# Patient Record
Sex: Male | Born: 1965 | Race: Asian | Hispanic: No | Marital: Married | Smoking: Never smoker
Health system: Southern US, Community
[De-identification: ages and names within clinical notes are randomized; demographics above are authoritative.]

## PROBLEM LIST (undated history)

## (undated) DIAGNOSIS — E785 Hyperlipidemia, unspecified: Secondary | ICD-10-CM

## (undated) DIAGNOSIS — M5430 Sciatica, unspecified side: Secondary | ICD-10-CM

## (undated) DIAGNOSIS — M5440 Lumbago with sciatica, unspecified side: Secondary | ICD-10-CM

## (undated) HISTORY — DX: Hyperlipidemia, unspecified: E78.5

## (undated) HISTORY — DX: Sciatica, unspecified side: M54.30

## (undated) HISTORY — DX: Lumbago with sciatica, unspecified side: M54.40

## (undated) HISTORY — PX: TONSILLECTOMY: SUR1361

---

## 2017-05-06 ENCOUNTER — Ambulatory Visit (INDEPENDENT_AMBULATORY_CARE_PROVIDER_SITE_OTHER): Payer: Self-pay | Admitting: Family

## 2017-05-06 VITALS — BP 122/82 | HR 62 | Temp 98.3°F | Resp 16 | Ht 70.0 in | Wt 176.0 lb

## 2017-05-06 DIAGNOSIS — K5900 Constipation, unspecified: Secondary | ICD-10-CM | POA: Insufficient documentation

## 2017-05-06 NOTE — Progress Notes (Signed)
Blake Rhodes  Chief Complaint  Patient presents with  . Abdominal Pain    lower right by the navel x 3-4 dys      ICD-10-CM   1. Constipation, unspecified constipation type K59.00     Patient Active Problem List   Diagnosis Date Noted  . Constipation 05/06/2017    No past medical history on file.  No past surgical history on file.  No Known Allergies  BP 122/82 (BP Location: Right Arm, Patient Position: Sitting, Cuff Size: Large)   Pulse 62   Temp 98.3 F (36.8 C) (Oral)   Resp 16   Ht  (1.778 m)   Wt 176 lb (79.8 kg)   SpO2 98%   BMI 25.25 kg/m   Review of Systems  Constitutional: Negative for chills, diaphoresis, fever, malaise/fatigue and weight loss.  HENT: Negative.   Eyes: Negative.  Negative for blurred vision.  Respiratory: Negative.   Cardiovascular: Negative.  Negative for chest pain.  Gastrointestinal: Positive for abdominal pain and constipation. Negative for blood in stool, diarrhea, heartburn, nausea and vomiting.  Genitourinary: Negative.   Musculoskeletal: Negative for back pain, falls, joint pain, myalgias and neck pain.       Pt had a question about left trapezius tension unrelated to CC of GI upset  Skin: Negative for rash.  Neurological: Negative.  Negative for dizziness, tremors, focal weakness, weakness and headaches.  Endo/Heme/Allergies: Negative.   Psychiatric/Behavioral: Negative.    Physical Exam  Constitutional: He is oriented to person, place, and time. He appears well-developed and well-nourished.  HENT:  Head: Normocephalic and atraumatic.  Mouth/Throat: Oropharynx is clear and moist.  Eyes: Conjunctivae are normal.  Neck: Normal range of motion. Neck supple.  Cardiovascular: Normal rate, regular rhythm and normal heart sounds.   Pulmonary/Chest: Effort normal and breath sounds normal.  Abdominal: Soft. He exhibits no distension and no mass. There is tenderness. There is no rebound and no guarding. No hernia.  BSA and WNL 3  quadrants except Hypoactive (yet audible) BS RLQ, mild TTP no guarding, - Murphy's, - Psoas, - radiation to other areas, pain localized to RLQ 2in to immediate right of naval, no visible distension, yet pt has reported distension and feeling overly full post-prandial  Genitourinary:  Genitourinary Comments: deferred  Musculoskeletal: Normal range of motion. He exhibits no tenderness.  Neurological: He is alert and oriented to person, place, and time.  Skin: Skin is warm and dry. Capillary refill takes less than 2 seconds.  Psychiatric: He has a normal mood and affect. His behavior is normal.  Vitals reviewed.   No results found for this or any previous visit (from the past 72 hour(s)).  No current outpatient prescriptions on file.  Subjective: "Starting Tuesday, I was having some pain in my abdomen when I was out of town at a conference. I work out usually twice a week. I worked out Wednesday when I returned."  Objective: Pt seen and chart reviewed. Pt is alert, oriented x3, calm, cooperative, appropriate, and in NAD. Pt reported mild RLQ epigastric pain at a focal point 2in to right of naval and feelings of distension even when eating small amounts, persistent and slightly worse since Tuesday, but not severe. Pt denies any known changes in diet (he reports healthy vegetarian diet), drinks, supplements, exotic foods, medications, or new food dishes recently. No known hx of similar symptoms. No known injury or straining in the gym (light cardio and machines; no heavy weights or high-impact routines). Pt reports that  approximately every 3-28mo, he does a "cleanse" where he may use magnesium citrate x1 bottle but has not done this in a long time. Denies any use of other laxatives or stool softeners. Denies blood in stool. No diarrhea, no vomiting. Pt reports mild constipation and reported that his stool was harder than usual and he felt as though he couldn't finish going properly. Asked pt about his  experience with mag citrate and he reports he can take it and have 3-4 good bowel movements, but no extreme reactions such as loose stools all evening/day.   See detailed ROS and physical exam details above.   Pt mentioned tightness in his left trapezius muscle, intermittent, since he moved here recently from [place], but not before the move. 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 Evaluated for focal muscle spasm although pt received a massage immediately prior to coming to [facility]; no focal spasm noted. ROM intact, no TTP, no pain with active/passive ROM. Mild tension in left trapezius when looking directly up. Evaluated for cervical and thoracic spinal subluxation with cross-reach stretches and no abnormalities noted, likely isolating this to the trapezius itself rather than spinal misalignment or compensation from other abnormalities. Advised pt to actively seek to improve the ergonomics of his workstation as well as sleeping situation, and continue to get regular massages as he had done today.   Assessment:  -Moderate constipation with probable hard stool in bowels (not large enough to be palpable at this point)  Plan:  -Colace  po bid x 7-10 days, then may take daily PRN -Magnesium Citrate 1 bottle tonight, may repeat in 24h -Pt to avoid major dietary changes at this time as his diet is fairly bland/non-irritating; will continue to eat lighter soft foods -Pt to call me at clinic tomorrow if any concerns or if any nausea so I can call in some Zofran -If unresolved in the next few days or if vomiting/diarrhea or other concerning symptoms such as fever/chills or sharp pain, pt aware to be seen face-to-face  Beau Fanny, FNP 05/06/2017 2:48 PM

## 2017-05-06 NOTE — Patient Instructions (Signed)
Take Colace  (or pharmacy generic for this) twice daily for the next 7-10 days. Take 1 bottle of Magnesium Citrate this evening (you may mix this with gatorade for better flavor). You may repeat the Magnesium Citrate tomorrow if necessary although I am hopeful this will resolve the problem. If you have any questions or need anything for nausea at any point, I am here tomorrow and will call something in for you.

## 2018-01-09 DIAGNOSIS — H524 Presbyopia: Secondary | ICD-10-CM | POA: Diagnosis not present

## 2018-04-20 DIAGNOSIS — M7662 Achilles tendinitis, left leg: Secondary | ICD-10-CM | POA: Diagnosis not present

## 2018-04-23 ENCOUNTER — Encounter: Payer: Self-pay | Admitting: Physical Therapy

## 2018-04-23 ENCOUNTER — Other Ambulatory Visit: Payer: Self-pay

## 2018-04-23 ENCOUNTER — Ambulatory Visit: Payer: 59 | Attending: Orthopedic Surgery | Admitting: Physical Therapy

## 2018-04-23 DIAGNOSIS — M25572 Pain in left ankle and joints of left foot: Secondary | ICD-10-CM | POA: Diagnosis not present

## 2018-04-23 DIAGNOSIS — M7662 Achilles tendinitis, left leg: Secondary | ICD-10-CM | POA: Diagnosis not present

## 2018-04-23 DIAGNOSIS — R262 Difficulty in walking, not elsewhere classified: Secondary | ICD-10-CM | POA: Insufficient documentation

## 2018-04-23 NOTE — Therapy (Signed)
[facility] [address] [place], [place], [address] Phone: [phone]   Fax:  [phone]  Physical Therapy Evaluation  Patient Details  Name: Blake Rhodes MRN: [id number] Date of Birth: [birth year] Referring Provider: Margarita Rana, MD   Encounter Date: 04/23/2018  PT End of Session - 04/23/18 1709    Visit Number  1    Number of Visits  13    Date for PT Re-Evaluation  06/04/18    Authorization Type  UMR    PT Start Time  1637   pt arrived late   PT Stop Time  1711    PT Time Calculation (min)  34 min    Activity Tolerance  Patient tolerated treatment well    Behavior During Therapy  [facility] for tasks assessed/performed       History reviewed. No pertinent past medical history.  History reviewed. No pertinent surgical history.  There were no vitals filed for this visit.   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 Played tennis last weekend without pain and went to gym monday night, I felt pain as soon as I put my foot down on the floor Tues morning. Was placed in boot yesterday and instructed in RICE.     How long can you walk comfortably?  unable at this time    Patient Stated Goals  reduce pain, run, tennis, long term HEP    Currently in Pain?  No/denies   no pain when in boot        [facility] PT Assessment - 04/23/18 0001      Assessment   Medical Diagnosis  insertional achilles tendonitis    Referring Provider  Margarita Rana, MD    Onset Date/Surgical Date  --   last week   Hand Dominance  Right    Next MD Visit  --   6 weeks   Prior Therapy  yes      Precautions   Precautions  None      Restrictions   Weight Bearing Restrictions  No      Balance Screen   Has the patient fallen in the past 6 months  No      Home Environment   Living Environment  Private residence      Prior Function   Level of Independence  Independent    Vocation  Requirements  IT      Cognition   Overall Cognitive Status  Within Functional Limits for tasks assessed      Observation/Other Assessments   Focus on Therapeutic Outcomes (FOTO)   74% limited      Sensation   Additional Comments  WFL      ROM / Strength   AROM / PROM / Strength  AROM;PROM      AROM   Overall AROM Comments  DF 3      PROM   Overall PROM Comments  DF 10      Palpation   Palpation comment  TTP distal achilles insertion      Ambulation/Gait   Gait Comments  wearing boot                Objective measurements completed on examination: See above findings.      [facility] Adult PT Treatment/Exercise - 04/23/18 0001      Modalities   Modalities  Iontophoresis      Iontophoresis   Type of Iontophoresis  Dexamethasone  Location  Lt distal achilles insertion    Dose  1cc    Time  6 hr wear             PT Education - 04/23/18 1727    Education Details  anatomy of condition, POC, HEP, exercise form/rationale, ionto    Person(s) Educated  Patient    Methods  Explanation    Comprehension  Verbalized understanding;Need further instruction          PT Long Term Goals - 04/23/18 1720      PT LONG TERM GOAL #1   Title  Pt will be able to jog without increased ankle pain    Baseline  unable at eval    Time  6    Period  Weeks    Status  New    Target Date  06/08/18      PT LONG TERM GOAL #2   Title  Pt will be able to perform plyometrics necessary for tennis to return to PLOF    Baseline  unable at eval    Time  6    Period  Weeks    Status  New    Target Date  06/08/18      PT LONG TERM GOAL #3   Title  pt will be independent in long term HEP for management of Haglunds deformity    Baseline  will progress and establish as appropriate    Time  6    Period  Weeks    Status  New    Target Date  06/08/18      PT LONG TERM GOAL #4   Title  FOTO to 37% limitation    Baseline  74% limited at eval    Time  6    Period  Weeks     Status  New    Target Date  06/08/18             Plan - 04/23/18 1715    Clinical Impression Statement  Pt presents to PT with complaints of Lt heel pain that began last week. Has had problems with this in the past and was diagnosed with Haglunds deformity. Pt has good PROM but is limited by pain. Applied ionto per referral and asked pt to remove boot to move foot as often as possible, perform DF stretch with towel and use ice massage rather than an ice pack. Pt will benefit from skilled PT to decrease acute pain and return to PLOF.     Clinical Presentation  Stable    Clinical Decision Making  Low    Rehab Potential  Good    PT Frequency  2x / week    PT Duration  6 weeks    PT Treatment/Interventions  ADLs/Self Care Home Management;Cryotherapy;Electrical Stimulation;Iontophoresis 4mg /ml Dexamethasone;Moist Heat;Therapeutic exercise;Therapeutic activities;Gait training;Ultrasound;Neuromuscular re-education;Patient/family education;Manual techniques;Taping;Dry needling;Passive range of motion    PT Next Visit Plan  outcome from ionto?, check gastroc/soleus, DF stretch, joint mobilization, foot intrisic activation    PT Home Exercise Plan  DF stretch with towel, AROM to tolerance, ice massage    Consulted and Agree with Plan of Care  Patient       Patient will benefit from skilled therapeutic intervention in order to improve the following deficits and impairments:  Abnormal gait, Decreased activity tolerance, Pain, Difficulty walking, Improper body mechanics  Visit Diagnosis: Pain in left ankle and joints of left foot - Plan: PT plan of care cert/re-cert  Difficulty in walking, not elsewhere  classified - Plan: PT plan of care cert/re-cert     Problem List Patient Active Problem List   Diagnosis Date Noted  . Constipation 05/06/2017    Gid Schoffstall C. Lary Eckardt PT, [name] 04/23/18 5:29 PM   [facility] [address] [place], [place], [address] Phone: [phone]   Fax:  [phone]  Name: Blake Rhodes MRN: [id number] Date of Birth: Dec 09, 1965

## 2018-04-27 ENCOUNTER — Encounter: Payer: Self-pay | Admitting: Physical Therapy

## 2018-04-27 ENCOUNTER — Ambulatory Visit: Payer: 59 | Admitting: Physical Therapy

## 2018-04-27 DIAGNOSIS — R262 Difficulty in walking, not elsewhere classified: Secondary | ICD-10-CM | POA: Diagnosis not present

## 2018-04-27 DIAGNOSIS — M25572 Pain in left ankle and joints of left foot: Secondary | ICD-10-CM | POA: Diagnosis not present

## 2018-04-27 NOTE — Therapy (Signed)
[facility] Out[facility] [address] [place], [place], [address] Phone: [phone]   Fax:  [phone]  Physical Therapy Treatment  Patient Details  Name: Blake Rhodes MRN: [id number] Date of Birth: 1966-07-27 Referring Provider (PT): Margarita Rana, MD   Encounter Date: 04/27/2018  PT End of Session - 04/27/18 1219    Visit Number  2    Number of Visits  13    Date for PT Re-Evaluation  06/04/18    Authorization Type  UMR    PT Start Time  0907    PT Stop Time  0950    PT Time Calculation (min)  43 min    Activity Tolerance  Patient tolerated treatment well    Behavior During Therapy  [facility] for tasks assessed/performed       History reviewed. No pertinent past medical history.  History reviewed. No pertinent surgical history.  There were no vitals filed for this visit.  Subjective Assessment - 04/27/18 0908    Subjective  pain has reduced with walking since Monday when it was very painful. I carefully put the weight on the lateral side of my foot.   (Pended)     Patient Stated Goals  reduce pain, run, tennis, long term HEP  (Pended)                        [facility] Adult PT Treatment/Exercise - 04/27/18 0001      Modalities   Modalities  Ultrasound      Ultrasound   Ultrasound Location  Distal achilles insertion, Lt    Ultrasound Parameters  pulsed, .8 w/cm2    Ultrasound Goals  Pain      Iontophoresis   Type of Iontophoresis  Dexamethasone    Location  Lt distal achilles insertion    Dose  1cc    Time  6 hr wear      Manual Therapy   Manual Therapy  Soft tissue mobilization;Joint mobilization    Manual therapy comments  LLLD passive DF stretch    Joint Mobilization  eversion mobs    Soft tissue mobilization  IASTM gastroc/soleus             PT Education - 04/27/18 1219    Education Details  LLLD stretching, shoe wear, gait pattern, rationale for treatment    Person(s) Educated  Patient    Methods   Explanation    Comprehension  Verbalized understanding;Need further instruction          PT Long Term Goals - 04/23/18 1720      PT LONG TERM GOAL #1   Title  Pt will be able to jog without increased ankle pain    Baseline  unable at eval    Time  6    Period  Weeks    Status  New    Target Date  06/08/18      PT LONG TERM GOAL #2   Title  Pt will be able to perform plyometrics necessary for tennis to return to PLOF    Baseline  unable at eval    Time  6    Period  Weeks    Status  New    Target Date  06/08/18      PT LONG TERM GOAL #3   Title  pt will be independent in long term HEP for management of Haglunds deformity    Baseline  will progress and establish as appropriate  Time  6    Period  Weeks    Status  New    Target Date  06/08/18      PT LONG TERM GOAL #4   Title  FOTO to 37% limitation    Baseline  74% limited at eval    Time  6    Period  Weeks    Status  New    Target Date  06/08/18            Plan - 04/27/18 1223    Clinical Impression Statement  limited eversion mobility addressed with mobs today and improved. Pt has been ambulating with pressure on lateral foot and was asked to wear tennis shoes with neutral gait pattern. Was able to stand on foot in neutral alignment after treatment today without pain. Advised that he can use the bike at the gym rather than run. Discussed proper stretching at home and importance of regular mobility out of the boot to avoid getting too stiff which he says is difficult for him because he is so busy at work.     PT Treatment/Interventions  ADLs/Self Care Home Management;Cryotherapy;Electrical Stimulation;Iontophoresis 4mg /ml Dexamethasone;Moist Heat;Therapeutic exercise;Therapeutic activities;Gait training;Ultrasound;Neuromuscular re-education;Patient/family education;Manual techniques;Taping;Dry needling;Passive range of motion    PT Next Visit Plan  joint mobs, foot intrinsics, stretching, gentle CKC    PT Home  Exercise Plan  DF stretch with towel, AROM to tolerance, ice massage    Consulted and Agree with Plan of Care  Patient       Patient will benefit from skilled therapeutic intervention in order to improve the following deficits and impairments:  Abnormal gait, Decreased activity tolerance, Pain, Difficulty walking, Improper body mechanics  Visit Diagnosis: Pain in left ankle and joints of left foot  Difficulty in walking, not elsewhere classified     Problem List Patient Active Problem List   Diagnosis Date Noted  . Constipation 05/06/2017    Shajuana Mclucas C. Kohei Antonellis PT, [name] 04/27/18 12:27 PM   [facility] Rehabilitation [facility] [address] [place], [place], [address] Phone: [phone]   Fax:  [phone]  Name: Blake Rhodes MRN: [id number] Date of Birth: 11-Dec-1965

## 2018-05-01 ENCOUNTER — Ambulatory Visit: Payer: 59 | Attending: Orthopedic Surgery | Admitting: Physical Therapy

## 2018-05-01 ENCOUNTER — Other Ambulatory Visit: Payer: Self-pay

## 2018-05-01 ENCOUNTER — Ambulatory Visit: Payer: 59 | Admitting: Physical Therapy

## 2018-05-01 DIAGNOSIS — R262 Difficulty in walking, not elsewhere classified: Secondary | ICD-10-CM | POA: Diagnosis not present

## 2018-05-01 DIAGNOSIS — M25572 Pain in left ankle and joints of left foot: Secondary | ICD-10-CM | POA: Diagnosis not present

## 2018-05-01 NOTE — Therapy (Signed)
[facility] [address] [place], [place], [address] Phone: [phone]   Fax:  [phone]  Physical Therapy Treatment  Patient Details  Name: Quamere Mussell MRN: [id number] Date of Birth: January 07, 1966 Referring Provider (PT): Margarita Rana, MD   Encounter Date: 05/01/2018  PT End of Session - 05/01/18 1809    Visit Number  3    Number of Visits  13    Date for PT Re-Evaluation  06/04/18    Authorization Type  UMR    PT Start Time  1720    PT Stop Time  1800    PT Time Calculation (min)  40 min    Activity Tolerance  Patient tolerated treatment well    Behavior During Therapy  [facility] for tasks assessed/performed       No past medical history on file.  No past surgical history on file.  There were no vitals filed for this visit.  Subjective Assessment - 05/01/18 1802    Subjective  Pt. reports mild improvement since last visit. Decreased pain/swelling but still having difficulty walking. Pt. now out of boot. He reports ionto seemed to help last week but does not feel this is necessary today.    Patient Stated Goals  reduce pain, run, tennis, long term HEP    Currently in Pain?  Yes    Pain Score  4     Pain Location  Heel    Pain Orientation  Left                       [facility] Adult PT Treatment/Exercise - 05/01/18 0001      Exercises   Exercises  Ankle      Modalities   Modalities  Ultrasound      Ultrasound   Ultrasound Location  Left distal Achilles    Ultrasound Parameters  pulsed, .8 W/cm2    Ultrasound Goals  Pain      Manual Therapy   Manual Therapy  Soft tissue mobilization;Joint mobilization    Manual therapy comments  STM including trigger point ischemic compression left medial gastroc emphasis, left ankle mobilization for eversion grade I-III    Joint Mobilization  eversion mobilization    Soft tissue mobilization  left medial gastroc emphasis      Ankle Exercises: Stretches   Gastroc  Stretch  --   supine manual stretch 3x30 sec   Slant Board Stretch  3 reps;30 seconds    Other Stretch  reviewed wall stretch and stretch off edge of step for potential HEP variation      Ankle Exercises: Standing   Heel Raises  --   eccentrics off of 4 in. step with left weightshift to lower     Ankle Exercises: Supine   Isometrics  plantar flexion isometrics 3-5 sec holds 2x10             PT Education - 05/01/18 1808    Education Details  HEP-reviewed gastroc stretch variations and added eccentrics 2-3 sets of 10 1-2 times per day    Person(s) Educated  Patient    Methods  Explanation;Demonstration;Verbal cues    Comprehension  Verbalized understanding;Returned demonstration          PT Long Term Goals - 04/23/18 1720      PT LONG TERM GOAL #1   Title  Pt will be able to jog without increased ankle pain    Baseline  unable at eval    Time  6    Period  Weeks    Status  New    Target Date  06/08/18      PT LONG TERM GOAL #2   Title  Pt will be able to perform plyometrics necessary for tennis to return to PLOF    Baseline  unable at eval    Time  6    Period  Weeks    Status  New    Target Date  06/08/18      PT LONG TERM GOAL #3   Title  pt will be independent in long term HEP for management of Haglunds deformity    Baseline  will progress and establish as appropriate    Time  6    Period  Weeks    Status  New    Target Date  06/08/18      PT LONG TERM GOAL #4   Title  FOTO to 37% limitation    Baseline  74% limited at eval    Time  6    Period  Weeks    Status  New    Target Date  06/08/18            Plan - 05/01/18 1810    Clinical Impression Statement  Pt. improved from last week with progression out of boot though still with some antalgia. Able to progress exercises with eccentrics and standing stretches with good tolerance. Pt. would benefit from continued PT for further progress to address functional limitations for mobility.     Clinical Presentation  Stable    Clinical Decision Making  Low    Rehab Potential  Good    PT Frequency  2x / week    PT Duration  6 weeks    PT Treatment/Interventions  ADLs/Self Care Home Management;Cryotherapy;Electrical Stimulation;Iontophoresis 4mg /ml Dexamethasone;Moist Heat;Therapeutic exercise;Therapeutic activities;Gait training;Ultrasound;Neuromuscular re-education;Patient/family education;Manual techniques;Taping;Dry needling;Passive range of motion    PT Next Visit Plan  continue STM, joint mobs, [place], exercise progression, further ionto if needed    PT Home Exercise Plan  Standing gastroc stretches (wall), eccentrics added off edge of step    Consulted and Agree with Plan of Care  Patient       Patient will benefit from skilled therapeutic intervention in order to improve the following deficits and impairments:  Abnormal gait, Decreased activity tolerance, Pain, Difficulty walking, Improper body mechanics  Visit Diagnosis: Pain in left ankle and joints of left foot  Difficulty in walking, not elsewhere classified     Problem List Patient Active Problem List   Diagnosis Date Noted  . Constipation 05/06/2017    Di Kindle Araeya Lamb, PT, [name] 05/01/2018, 6:14 PM  [facility] [address] [place], [place], [address] Phone: [phone]   Fax:  [phone]  Name: Beren Yniguez MRN: [id number] Date of Birth: 1966/05/14

## 2018-05-03 ENCOUNTER — Ambulatory Visit: Payer: 59 | Admitting: Physical Therapy

## 2018-05-07 ENCOUNTER — Encounter: Payer: Self-pay | Admitting: Physical Therapy

## 2018-05-07 ENCOUNTER — Other Ambulatory Visit: Payer: Self-pay

## 2018-05-07 ENCOUNTER — Ambulatory Visit: Payer: 59 | Admitting: Physical Therapy

## 2018-05-07 DIAGNOSIS — R262 Difficulty in walking, not elsewhere classified: Secondary | ICD-10-CM | POA: Diagnosis not present

## 2018-05-07 DIAGNOSIS — M25572 Pain in left ankle and joints of left foot: Secondary | ICD-10-CM

## 2018-05-07 NOTE — Therapy (Signed)
[facility] Outpatient Rehabilitation [facility] [address] [place], [place], [address] Phone: [phone]   Fax:  [phone]  Physical Therapy Treatment  Patient Details  Name: Blake Rhodes MRN: [id number] Date of Birth: 12-Jul-1966 Referring Provider (PT): Margarita Rana, MD   Encounter Date: 05/07/2018  PT End of Session - 05/07/18 2056    Visit Number  4    Number of Visits  12    Date for PT Re-Evaluation  06/04/18    Authorization Type  UMR    PT Start Time  1723    PT Stop Time  1802    PT Time Calculation (min)  39 min    Activity Tolerance  Patient tolerated treatment well    Behavior During Therapy  [facility] for tasks assessed/performed       History reviewed. No pertinent past medical history.  No past surgical history on file.  There were no vitals filed for this visit.  Subjective Assessment - 05/07/18 2047    Subjective  No pain pre-treatment today. Pt. reports some mild soreness 1-2 days after last treatment but since walking without pain for daily mobility. He still has not returned to PLOF in terms of abilty for fitness activities.    Limitations  Walking   fitness activities, tennis   How long can you walk comfortably?  No minimal limitations with daily mobility for work, ADLs but still limitations for more prolonged activity, unable to run    Patient Stated Goals  reduce pain, run, tennis, long term HEP    Currently in Pain?  No/denies         [facility] PT Assessment - 05/07/18 0001      AROM   Overall AROM Comments  DF 5       PROM   Overall PROM Comments  DF 10      Ambulation/Gait   Gait Comments  now out of boot, no significant antalgia                   [facility] Adult PT Treatment/Exercise - 05/07/18 0001      Exercises   Exercises  Ankle      Modalities   Modalities  Ultrasound      Ultrasound   Ultrasound Location  Left distal Achilles/insertion    Ultrasound Parameters  50% 0.8 W/cm2 3 MHZ    Ultrasound Goals   Pain      Manual Therapy   Manual Therapy  Soft tissue mobilization;Joint mobilization    Manual therapy comments  STM including trigger point ischemic compression left medial gastroc emphasis, left ankle mobilization for eversion grade I-III   incl. also IASTM with roller to left gastroc   Joint Mobilization  eversion mobilization grade I-III    Soft tissue mobilization  left medial gastroc emphasis      Ankle Exercises: Stretches   Soleus Stretch  3 reps;30 seconds   supine manual stretch   Gastroc Stretch  3 reps;30 seconds   supine manual stretch   Slant Board Stretch  3 reps;30 seconds      Ankle Exercises: Standing   Heel Raises  20 reps   eccentrics of 4" step            PT Education - 05/07/18 2055    Education Details  Brief HEP review gastroc stretches, eccentrics, advised to still hold off on jogging this week but discussed gradual reintroduction activities as long as pain free    Person(s) Educated  Patient  Methods  Explanation    Comprehension  Verbalized understanding          PT Long Term Goals - 05/07/18 2103      PT LONG TERM GOAL #1   Title  Pt will be able to jog without increased ankle pain    Baseline  unable at eval-still unable as of 05/07/18    Time  6    Period  Weeks    Target Date  06/08/18      PT LONG TERM GOAL #2   Title  Pt will be able to perform plyometrics necessary for tennis to return to PLOF    Baseline  unable at eval, still unable as of 05/07/18    Time  6    Period  Weeks    Status  On-going    Target Date  06/08/18      PT LONG TERM GOAL #3   Title  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 progressing well with therapy with decreased Achilles pain and subsequent functional gains for walking ability. Mild gains in ankle DF ROM from baseline, still with left gastroc soft tissue restriction. Progressing re: therapy goals but still wih functional limitations for more involved mobility and fitness activities.     Clinical Presentation  Stable    Clinical Decision Making  Low    Rehab Potential  Good    PT Frequency  2x / week    PT Duration  6 weeks    PT Treatment/Interventions  ADLs/Self Care Home Management;Cryotherapy;Electrical Stimulation;Iontophoresis 4mg /ml Dexamethasone;Moist Heat;Therapeutic exercise;Therapeutic activities;Gait training;Ultrasound;Neuromuscular re-education;Patient/family education;Manual techniques;Taping;Dry needling;Passive range of motion    PT Next Visit Plan  continue STM, joint mobs, stretches [place], CKC exercise and functional activity pending painprogression, further ionto if needed    PT Home Exercise Plan  Standing gastroc stretches, eccentrics off edge of step    Consulted and Agree with Plan of Care  Patient       Patient will benefit from skilled therapeutic intervention in order to improve the following deficits and impairments:  Abnormal gait, Decreased activity tolerance, Pain, Difficulty walking, Improper body mechanics  Visit Diagnosis: Pain in left ankle and joints of left foot  Difficulty in walking, not elsewhere classified     Problem List Patient Active Problem List   Diagnosis Date Noted  . Constipation 05/06/2017    Lazarus Gowda, PT, [name] 05/07/18 9:09 PM  [facility] Rehabilitation [facility] [address] [place], [place], [address] Phone: [phone]   Fax:  [phone]  Name: Conlin Brahm MRN: [id number] Date of Birth: [birth year]

## 2018-05-11 ENCOUNTER — Ambulatory Visit: Payer: 59 | Admitting: Physical Therapy

## 2018-05-14 ENCOUNTER — Ambulatory Visit: Payer: 59 | Admitting: Physical Therapy

## 2018-05-14 DIAGNOSIS — R262 Difficulty in walking, not elsewhere classified: Secondary | ICD-10-CM | POA: Diagnosis not present

## 2018-05-14 DIAGNOSIS — M25572 Pain in left ankle and joints of left foot: Secondary | ICD-10-CM

## 2018-05-14 NOTE — Therapy (Signed)
[facility] Outpatient Rehabilitation [facility] [address] [place], [place], [address] Phone: [phone]   Fax:  [phone]  Physical Therapy Treatment  Patient Details  Name: Blake Rhodes MRN: [id number] Date of Birth: 02/06/1966 Referring Provider (PT): Margarita Rana, MD   Encounter Date: 05/14/2018  PT End of Session - 05/14/18 1806    Visit Number  5    Number of Visits  12    Date for PT Re-Evaluation  06/04/18    PT Start Time  1607   short session , patient late   PT Stop Time  1630    PT Time Calculation (min)  23 min    Activity Tolerance  Patient tolerated treatment well    Behavior During Therapy  [facility] for tasks assessed/performed       No past medical history on file.  No past surgical history on file.  There were no vitals filed for this visit.  Subjective Assessment - 05/14/18 1620    Subjective  Short session due to patient 20 minutes late. Patient was able to use bike and elliptical ( ramp 2)without pain.    Currently in Pain?  No/denies    Pain Location  Heel    Pain Orientation  Left    Multiple Pain Sites  No                       [facility] Adult PT Treatment/Exercise - 05/14/18 0001      Modalities   Modalities  Ultrasound      Ultrasound   Ultrasound Location  Lt distal achilles tendon insertion    Ultrasound Parameters  50% .8 watts/cm 2  Mhz pre set.    older machine   Ultrasound Goals  --   Flexibility     Ankle Exercises: Standing   Rocker Board  Other (comment)   PF/DF,  /  medlal/ lateral 10 x each,  cued initially  SBA   Heel Raises  20 reps   eccentrics of 4" step   Other Standing Ankle Exercises  SLS on pod plyotoss 20 x each LE.    Other Standing Ankle Exercises  EncourGED SOFTER WALKING VS POUNDING floor             PT Education - 05/14/18 1806    Education Details  Gait, shoes exercise progression    Person(s) Educated  Patient    Methods  Explanation    Comprehension  Verbalized  understanding          PT Long Term Goals - 05/14/18 1810      PT LONG TERM GOAL #1   Title  Pt will be able to jog without increased ankle pain    Baseline  asked him to not do    Time  6    Period  Weeks    Status  On-going      PT LONG TERM GOAL #2   Title  Pt will be able to perform plyometrics necessary for tennis to return to PLOF    Baseline  beginning plyometrics today    Time  6    Period  Weeks    Status  On-going      PT LONG TERM GOAL #3   Title  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  He also tolerated beginning plyimetric exercises in clinic without pain.  (progress toward LTG#2)ROM unchanged since last visit.  No pain today.    PT Next Visit Plan  , joint mobs, stretches  CKC exercise and functional activity pending painprogression,   Progress HEP    PT Home Exercise Plan  Standing gastroc stretches, eccentrics off edge of step    Consulted and Agree with Plan of Care  Patient       Patient will benefit from skilled therapeutic intervention in order to improve the following deficits and impairments:     Visit Diagnosis: Pain in left ankle and joints of left foot  Difficulty in walking, not elsewhere classified     Problem List Patient Active Problem List   Diagnosis Date Noted  . Constipation 05/06/2017    Chasitty Hehl PTA 05/14/2018, 6:12 PM  [facility] [address] [place], [place], [address] Phone: [phone]   Fax:  [phone]  Name: Blake Rhodes MRN: [id number] Date of Birth: July 19, 1966

## 2018-05-18 ENCOUNTER — Ambulatory Visit: Payer: 59 | Admitting: Physical Therapy

## 2018-05-22 ENCOUNTER — Encounter: Payer: 59 | Admitting: Physical Therapy

## 2018-05-25 ENCOUNTER — Ambulatory Visit: Payer: 59 | Admitting: Physical Therapy

## 2018-05-28 ENCOUNTER — Encounter: Payer: Self-pay | Admitting: Physical Therapy

## 2018-05-28 ENCOUNTER — Ambulatory Visit: Payer: 59 | Admitting: Physical Therapy

## 2018-05-28 DIAGNOSIS — R262 Difficulty in walking, not elsewhere classified: Secondary | ICD-10-CM

## 2018-05-28 DIAGNOSIS — M25572 Pain in left ankle and joints of left foot: Secondary | ICD-10-CM

## 2018-05-28 NOTE — Therapy (Signed)
[facility] Outpatient Rehabilitation [facility] [address] [place], [place], [address] Phone: [phone]   Fax:  [phone]  Physical Therapy Treatment  Patient Details  Name: Blake Rhodes MRN: [id number] Date of Birth: 10-19-1965 Referring Provider (PT): Margarita Rana, MD   Encounter Date: 05/28/2018  PT End of Session - 05/28/18 1828    Visit Number  6    Number of Visits  12    Date for PT Re-Evaluation  06/04/18    Authorization Type  UMR    PT Start Time  1317   pt arrived late   PT Stop Time  1339    PT Time Calculation (min)  22 min    Activity Tolerance  Patient tolerated treatment well    Behavior During Therapy  [facility] for tasks assessed/performed       History reviewed. No pertinent past medical history.  History reviewed. No pertinent surgical history.  There were no vitals filed for this visit.  Subjective Assessment - 05/28/18 1824    Subjective  I tried to run but after only a short time I had to stop because it began to feel like it was going to hurt. I forget to do the exercises- what else can I do?    Patient Stated Goals  reduce pain, run, tennis, long term HEP    Currently in Pain?  No/denies                               PT Education - 05/28/18 1826    Education Details  TPDN & expected outcomes, exercise alternatives at work    [employer]) Educated  Patient    Methods  Explanation;Verbal cues    Comprehension  Verbalized understanding;Verbal cues required          PT Long Term Goals - 05/14/18 1810      PT LONG TERM GOAL #1   Title  Pt will be able to jog without increased ankle pain    Baseline  asked him to not do    Time  6    Period  Weeks    Status  On-going      PT LONG TERM GOAL #2   Title  Pt will be able to perform plyometrics necessary for tennis to return to PLOF    Baseline  beginning plyometrics today    Time  6    Period  Weeks    Status  On-going      PT LONG TERM GOAL #3   Title  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 Pt tolerated well and reported feeling better upon standing. Discussed how to modify HEP to perform at work. Discussed progressive return to running and will add plyometrics at next visit.     PT Treatment/Interventions  ADLs/Self Care Home Management;Cryotherapy;Electrical Stimulation;Iontophoresis 4mg /ml Dexamethasone;Moist Heat;Therapeutic exercise;Therapeutic activities;Gait training;Ultrasound;Neuromuscular re-education;Patient/family education;Manual  techniques;Taping;Dry needling;Passive range of motion    PT Next Visit Plan  DN PRN, plyometrics if tolerated    PT Home Exercise Plan  Standing gastroc stretches, eccentrics off edge of step, gradual return to running    Consulted and Agree with Plan of Care  Patient       Patient will benefit from skilled therapeutic intervention in order to improve the following deficits and impairments:  Abnormal gait, Decreased activity tolerance, Pain, Difficulty walking, Improper body mechanics  Visit Diagnosis: Pain in left ankle and joints of left foot  Difficulty in walking, not elsewhere classified     Problem List Patient Active Problem List   Diagnosis Date Noted  . Constipation 05/06/2017   Janecia Palau C. Fauna Neuner PT, [name] 05/28/18 6:39 PM   [facility] Outpatient Rehabilitation [facility] [address] [place], [place], [address] Phone: [phone]   Fax:  [phone]  Name: Blake Rhodes MRN: [id number] Date of Birth: Oct 26, 1965

## 2018-06-01 ENCOUNTER — Ambulatory Visit: Payer: 59 | Attending: Orthopedic Surgery | Admitting: Physical Therapy

## 2018-06-01 DIAGNOSIS — R262 Difficulty in walking, not elsewhere classified: Secondary | ICD-10-CM | POA: Diagnosis not present

## 2018-06-01 DIAGNOSIS — M25572 Pain in left ankle and joints of left foot: Secondary | ICD-10-CM | POA: Diagnosis not present

## 2018-06-01 NOTE — Therapy (Signed)
[facility] Outpatient Rehabilitation [facility] [address] [place], [place], [address] Phone: [phone]   Fax:  [phone]  Physical Therapy Treatment  Patient Details  Name: Blake Rhodes MRN: [id number] Date of Birth: 1966-07-28 Referring Provider (PT): Margarita Rana, MD   Encounter Date: 06/01/2018  PT End of Session - 06/01/18 1231    Visit Number  7    Number of Visits  12    Date for PT Re-Evaluation  06/04/18    Authorization Type  UMR    PT Start Time  1155    PT Stop Time  1227    PT Time Calculation (min)  32 min    Activity Tolerance  Patient tolerated treatment well    Behavior During Therapy  [facility] for tasks assessed/performed       No past medical history on file.  No past surgical history on file.  There were no vitals filed for this visit.  Subjective Assessment - 06/01/18 1157    Subjective  can wear sneakers and one pair of dress shoes comfortably.     Patient Stated Goals  reduce pain, run, tennis, long term HEP    Currently in Pain?  No/denies                       [facility] Adult PT Treatment/Exercise - 06/01/18 0001      Ankle Exercises: Standing   Heel Raises  --   edge of step 3x10 with 3 10s stretches   Other Standing Ankle Exercises  retro stepping midline of foot    Other Standing Ankle Exercises  hops with soft landing; gait training for mid heel strike                  PT Long Term Goals - 05/14/18 1810      PT LONG TERM GOAL #1   Title  Pt will be able to jog without increased ankle pain    Baseline  asked him to not do    Time  6    Period  Weeks    Status  On-going      PT LONG TERM GOAL #2   Title  Pt will be able to perform plyometrics necessary for tennis to return to PLOF    Baseline  beginning plyometrics today    Time  6    Period  Weeks    Status  On-going      PT LONG TERM GOAL #3   Title  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 Focused on gait training to strike at midline of heel to equalize pressure Rt to Lt. Also practiced without shoes on softer surface and transitioned to tile which pt denied pain while performing. Difficulty softly absorbing plyometric movements so that was added to HEP. Pt feels much better after stretching gastroc but reports time limitations. added edge of step heel raises x10 with 10s stretch x3  as part of morning routine. Is planning to purchase slant board.     PT Treatment/Interventions  ADLs/Self Care Home Management;Cryotherapy;Electrical Stimulation;Iontophoresis 4mg /ml Dexamethasone;Moist Heat;Therapeutic exercise;Therapeutic activities;Gait training;Ultrasound;Neuromuscular re-education;Patient/family education;Manual techniques;Taping;Dry needling;Passive range of motion    PT Next Visit Plan  lateral plyometrics & progress straight plane    PT Home Exercise Plan  Standing gastroc stretches, eccentrics off edge of step, gradual return to running, 10 heel raises-3 stretches x3 in the am    Consulted and Agree with Plan of Care  Patient       Patient will benefit from skilled therapeutic intervention in order to improve the following deficits and impairments:  Abnormal gait, Decreased activity tolerance, Pain, Difficulty walking, Improper body mechanics  Visit Diagnosis: Pain in left ankle and joints of left foot  Difficulty in walking, not elsewhere classified     Problem List Patient Active Problem List   Diagnosis Date Noted  . Constipation 05/06/2017    Mylinda Brook C. Kemar Pandit PT, [name] 06/01/18 12:34 PM   [facility] Outpatient [facility] [address] [place], [place], [address] Phone: [phone]   Fax:  [phone]  Name: Blake Rhodes MRN: [id number] Date of Birth: 05-21-1966

## 2018-06-04 ENCOUNTER — Encounter: Payer: 59 | Admitting: Physical Therapy

## 2018-06-13 ENCOUNTER — Ambulatory Visit: Payer: 59

## 2018-06-13 DIAGNOSIS — R262 Difficulty in walking, not elsewhere classified: Secondary | ICD-10-CM

## 2018-06-13 DIAGNOSIS — M25572 Pain in left ankle and joints of left foot: Secondary | ICD-10-CM | POA: Diagnosis not present

## 2018-06-13 NOTE — Therapy (Signed)
[facility] [address] [place], [name], [address] Phone: [phone]   Fax:  [phone]  Physical Therapy Treatment  Patient Details  Name: Blake GhaziBakul Sarinana MRN: [id number] Date of Birth: Dec 21, 1965 Referring Provider (PT): Margarita Ranaimothy Murphy, MD   Encounter Date: 06/13/2018  PT End of Session - 06/13/18 1409    Visit Number  8    Number of Visits  12    Date for PT Re-Evaluation  06/04/18    PT Start Time  1340    PT Stop Time  1405    PT Time Calculation (min)  25 min    Activity Tolerance  Patient tolerated treatment well    Behavior During Therapy  [facility] for tasks assessed/performed       No past medical history on file.  No past surgical history on file.  There were no vitals filed for this visit.  Subjective Assessment - 06/13/18 1341    Subjective  doing elliptical and bike at gym, not running or playing tennis; note pain in calf when first wake up and not sure if having cramps or if not stretching enough.  Easlier to stretch in evening and at gym than in morning, but trying to do against wall in morning.     Pain Score  0-No pain                       [facility] Adult PT Treatment/Exercise - 06/13/18 0001      Ambulation/Gait   Gait Comments  gait forward no noted heel whip and straight positioning of foot/ankle throughout cycle      Ankle Exercises: Aerobic   Elliptical  level 1 LE only x 5 minutes      Ankle Exercises: Standing   Side Shuffle (Round Trip)  with sport cord for resistance both ways x 5      Ankle Exercises: Stretches   Gastroc Stretch  3 reps;30 seconds   off 8" step with UE support     Ankle Exercises: Plyometrics   Bilateral Jumping  10 reps;1 set   groudn level    Unilateral Jumping  10 reps;1 set   ground level            PT Education - 06/13/18 1405    Education Details  lateral jumping and initiate running on indoor track with good shoes    Person(s) Educated   Patient    Methods  Explanation;Verbal cues    Comprehension  Verbalized understanding          PT Long Term Goals - 05/14/18 1810      PT LONG TERM GOAL #1   Title  Pt will be able to jog without increased ankle pain    Baseline  asked him to not do    Time  6    Period  Weeks    Status  On-going      PT LONG TERM GOAL #2   Title  Pt will be able to perform plyometrics necessary for tennis to return to PLOF    Baseline  beginning plyometrics today    Time  6    Period  Weeks    Status  On-going      PT LONG TERM GOAL #3   Title  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  Education on progressing activities to include some running on indoor surface with appropriate footwear and to continue aggressive stretching.  Likely plan for d/c after two more visits.     PT Next Visit Plan  evaluate progress into running, contiue lateral plyometrics, if wearing running shoes note if appropriate. consider d/c if ready.    PT Home Exercise Plan  Standing gastroc stretches, eccentrics off edge of step, gradual return to running, 10 heel raises-3 stretches x3 in the am       Patient will benefit from skilled therapeutic intervention in order to improve the following deficits and impairments:     Visit Diagnosis: Pain in left ankle and joints of left foot  Difficulty in walking, not elsewhere classified     Problem List Patient Active Problem List   Diagnosis Date Noted  . Constipation 05/06/2017    Blake Rhodes , PT 06/13/2018, 3:06 PM  [facility] [address] [place], [place],  [address] Phone: [phone]   Fax:  [phone]  Name: Blake Rhodes MRN: [id number] Date of Birth: 1965-10-07

## 2018-06-18 ENCOUNTER — Encounter: Payer: 59 | Admitting: Physical Therapy

## 2018-06-18 ENCOUNTER — Encounter: Payer: Self-pay | Admitting: Physical Therapy

## 2018-06-18 ENCOUNTER — Ambulatory Visit: Payer: 59 | Admitting: Physical Therapy

## 2018-06-18 DIAGNOSIS — M25572 Pain in left ankle and joints of left foot: Secondary | ICD-10-CM | POA: Diagnosis not present

## 2018-06-18 DIAGNOSIS — R262 Difficulty in walking, not elsewhere classified: Secondary | ICD-10-CM | POA: Diagnosis not present

## 2018-06-18 NOTE — Therapy (Signed)
[facility] Outpatient Rehabilitation [facility] [address] [place], [place], [address] Phone: [phone]   Fax:  [phone]  Physical Therapy Treatment  Patient Details  Name: Blake Rhodes MRN: [id number] Date of Birth: Mar 31, 1966 Referring Provider (PT): Margarita Rana, MD   Encounter Date: 06/18/2018  PT End of Session - 06/18/18 1557    Visit Number  9    Number of Visits  12    Date for PT Re-Evaluation  06/29/18    Authorization Type  UMR    PT Start Time  1557   pt arrived late   PT Stop Time  1632    PT Time Calculation (min)  35 min    Activity Tolerance  Patient tolerated treatment well    Behavior During Therapy  [name] for tasks assessed/performed       History reviewed. No pertinent past medical history.  History reviewed. No pertinent surgical history.  There were no vitals filed for this visit.  Subjective Assessment - 06/18/18 1557    Subjective  has not tried plyometric laterals or running yet. Was standing for about an hour over the weekend and began to feel posterior leg pain- reports sciatic issues in the past.     Patient Stated Goals  reduce pain, run, tennis, long term HEP    Currently in Pain?  No/denies         [facility] PT Assessment - 06/18/18 0001      Assessment   Medical Diagnosis  insertional achilles tendonitis    Referring Provider (PT)  Margarita Rana, MD                   [facility] Adult PT Treatment/Exercise - 06/18/18 0001      Ankle Exercises: Plyometrics   Bilateral Jumping  --   bilat hopping with smooth landing   Plyometric Exercises  jax, tandem      Ankle Exercises: Stretches   Other Stretch  active & seated hamstring stretch             PT Education - 06/18/18 2017    Education Details  progression of return to sport, care of sciatic pain, POC    Person(s) Educated  Patient    Methods  Explanation;Demonstration    Comprehension  Verbalized understanding;Returned demonstration           PT Long Term Goals - 06/18/18 2025      PT LONG TERM GOAL #1   Title  Pt will be able to jog without increased ankle pain    Baseline  will increase to 5 minutes    Status  On-going      PT LONG TERM GOAL #2   Title  Pt will be able to perform plyometrics necessary for tennis to return to PLOF    Baseline  beginning plyometrics     Status  On-going      PT LONG TERM GOAL #3   Title  pt will 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 will try a short, 5 minute  run on the treadmill since he is unable to find an indoor track.     PT Treatment/Interventions  ADLs/Self Care Home Management;Cryotherapy;Electrical Stimulation;Iontophoresis 4mg /ml Dexamethasone;Moist Heat;Therapeutic exercise;Therapeutic activities;Gait training;Ultrasound;Neuromuscular re-education;Patient/family education;Manual techniques;Taping;Dry needling;Passive range of motion    PT Next Visit Plan  evaluate progression of plyometrics    PT Home Exercise Plan  Standing gastroc stretches, eccentrics off edge of step, gradual return to running, 10 heel raises-3 stretches x3 in the am    Consulted and Agree with Plan of Care  Patient       Patient will benefit from skilled therapeutic intervention in order to improve the following deficits and impairments:  Abnormal gait, Decreased activity tolerance, Pain, Difficulty walking, Improper body mechanics  Visit Diagnosis: Pain in left ankle and joints of left foot - Plan: PT plan of care cert/re-cert  Difficulty in walking, not elsewhere classified - Plan: PT plan of care cert/re-cert     Problem List Patient Active Problem List   Diagnosis  Date Noted  . Constipation 05/06/2017    Alexys Lobello C. Louise Victory PT, [name] 06/18/18 8:27 PM   [facility] [address] [place], [name], [address] Phone: [phone]   Fax:  [phone]  Name: Blake Rhodes MRN: [id number] Date of Birth: 1965-09-04

## 2018-06-22 ENCOUNTER — Ambulatory Visit: Payer: 59 | Admitting: Physical Therapy

## 2018-06-22 ENCOUNTER — Encounter: Payer: Self-pay | Admitting: Physical Therapy

## 2018-06-22 DIAGNOSIS — M25572 Pain in left ankle and joints of left foot: Secondary | ICD-10-CM | POA: Diagnosis not present

## 2018-06-22 DIAGNOSIS — R262 Difficulty in walking, not elsewhere classified: Secondary | ICD-10-CM

## 2018-06-22 NOTE — Therapy (Signed)
[place], [place], [address] Phone: [phone]   Fax:  [phone]  Physical Therapy Treatment/Discharge  Patient Details  Name: Blake Rhodes MRN: [id number] Date of Birth: [birth year] Referring Provider (PT): Edmonia Lynch, MD   Encounter Date: 06/22/2018  PT End of Session - 06/22/18 0934    Visit Number  10    Number of Visits  12    Date for PT Re-Evaluation  06/29/18    Authorization Type  UMR    PT Start Time  0909    PT Stop Time  0920    PT Time Calculation (min)  11 min    Activity Tolerance  Patient tolerated treatment well    Behavior During Therapy  [facility] for tasks assessed/performed       History reviewed. No pertinent past medical history.  History reviewed. No pertinent surgical history.  There were no vitals filed for this visit.  Subjective Assessment - 06/22/18 0927    Subjective  tried running monday 5 min and was okay. Felt a little discomfort about 2 min into running on Wed and stopped, was fine about an hour later. Has to be cognisant of gait pattern still.    Patient Stated Goals  reduce pain, run, tennis, long term HEP    Currently in Pain?  No/denies         [facility] PT Assessment - 06/22/18 0001      Assessment   Medical Diagnosis  insertional achilles tendonitis    Referring Provider (PT)  Edmonia Lynch, MD                           PT Education - 06/22/18 [phone]    Education Details  maintenance in long term, progression of running tolerance    Person(s) Educated  Patient    Methods  Explanation    Comprehension  Verbalized understanding          PT Long Term Goals - 06/22/18 0935      PT LONG TERM GOAL #1   Title  Pt will be able to jog without increased ankle pain    Baseline  can for 5 min, has not challenged past that point    Status  Partially Met      PT LONG TERM GOAL #2   Title  Pt will be able to perform plyometrics  necessary for tennis to return to PLOF    Baseline  beginning and progressing plyometric endurance tolerance    Status  Partially Met      PT LONG TERM GOAL #3   Title  pt will be independent in 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 Encouraged him to contact us with any further questions.     PT Treatment/Interventions  ADLs/Self Care Home Management;Cryotherapy;Electrical Stimulation;Iontophoresis 3m/ml Dexamethasone;Moist Heat;Therapeutic exercise;Therapeutic activities;Gait training;Ultrasound;Neuromuscular re-education;Patient/family education;Manual techniques;Taping;Dry needling;Passive range of motion    PT Home Exercise Plan  Standing gastroc stretches, eccentrics off edge of step, gradual return to running, 10 heel raises-3 stretches x3  in the am    Consulted and Agree with Plan of Care  Patient       Patient will benefit from skilled therapeutic intervention in order to improve the following deficits and impairments:  Abnormal gait, Decreased activity tolerance, Pain, Difficulty walking, Improper body mechanics  Visit Diagnosis: Pain in left ankle and joints of left foot  Difficulty in walking, not elsewhere classified     Problem List Patient Active Problem List   Diagnosis Date Noted  . Constipation 05/06/2017  PHYSICAL THERAPY DISCHARGE SUMMARY  Visits from Start of Care: 10  Current functional level related to goals / functional outcomes: See above   Remaining deficits: See above   Education / Equipment: Anatomy of condition, POC, HEP, exercise form/rationale  Plan: Patient agrees to discharge.  Patient goals were partially met. Patient is being discharged due  to being pleased with the current functional level.  ?????      Kenika Sahm C. Arelyn Gauer PT, [name] 06/22/18 9:38 AM   [facility] [address] [place], [place], [address] Phone: [phone]   Fax:  [phone]  Name: Blake Rhodes MRN: [id number] Date of Birth: [birth year]

## 2018-11-09 DIAGNOSIS — M7661 Achilles tendinitis, right leg: Secondary | ICD-10-CM | POA: Diagnosis not present

## 2018-11-12 ENCOUNTER — Encounter: Payer: Self-pay | Admitting: Physical Therapy

## 2018-11-12 ENCOUNTER — Other Ambulatory Visit: Payer: Self-pay

## 2018-11-12 ENCOUNTER — Ambulatory Visit: Payer: 59 | Attending: Physician Assistant | Admitting: Physical Therapy

## 2018-11-12 DIAGNOSIS — R262 Difficulty in walking, not elsewhere classified: Secondary | ICD-10-CM | POA: Insufficient documentation

## 2018-11-12 DIAGNOSIS — M25572 Pain in left ankle and joints of left foot: Secondary | ICD-10-CM | POA: Diagnosis not present

## 2018-11-12 DIAGNOSIS — M25571 Pain in right ankle and joints of right foot: Secondary | ICD-10-CM | POA: Diagnosis not present

## 2018-11-12 NOTE — Therapy (Signed)
[name], [name], [address] Phone: [phone]   Fax:  [phone]  Physical Therapy Evaluation  Patient Details  Name: Blake GhaziBakul Rhodes MRN: [id number] Date of Birth: [birth year] Referring Provider (PT): BoykinsShepperson, Kirstin, [place] (Dr Farris HasKramer)   Encounter Date: 11/12/2018  PT End of Session - 11/12/18 1249    Visit Number  1    Number of Visits  13    Date for PT Re-Evaluation  12/28/18    PT Start Time  1210   pt arrived late   PT Stop Time  1249    PT Time Calculation (min)  39 min    Activity Tolerance  Patient tolerated treatment well    Behavior During Therapy  [facility] for tasks assessed/performed       History reviewed. No pertinent past medical history.  History reviewed. No pertinent surgical history.  There were no vitals filed for this visit.   Subjective Assessment - 11/12/18 1215    Subjective  Have been taking prednisone since Saturday which is helping. Icing a lot that helps. Biggest problem is sleeping. Pain is the same with and without boot. Was running and playing tennis but not very good about stretching. Still a lot at work but is going to use a standing desk.     Pertinent History  bilat Haglunds deformity    Limitations  Standing;Walking;House hold activities    How long can you stand comfortably?  unable    How long can you walk comfortably?  unable    Patient Stated Goals  decrease pain, walk, play tennis    Currently in Pain?  Yes    Pain Score  3     Pain Location  Heel    Pain Orientation  Right    Pain Descriptors / Indicators  Sharp    Pain Onset  In the past 7 days    Pain Frequency  Constant    Aggravating Factors   weight bearing, excessive movement    Pain Relieving Factors  ice         [facility] PT Assessment - 11/12/18 0001      Assessment   Medical Diagnosis  R achilles tendinits with haglund deformity    Referring Provider (PT)  Tomasa RandShepperson, Kirstin, PA-C   Dr  Farris HasKramer   Onset Date/Surgical Date  11/08/18    Hand Dominance  Right    Prior Therapy  6 mo ago for Left ankle      Precautions   Precaution Comments  Haglunds deformity      Restrictions   Weight Bearing Restrictions  No      Balance Screen   Has the patient fallen in the past 6 months  No      Home Environment   Living Environment  Private residence    Living Arrangements  Spouse/significant other;Children    Type of Home  House    Additional Comments  stars in home      Prior Function   Level of Independence  Independent    Vocation  Full time employment    Vocation Requirements  sitting at desk    Leisure  running, tennis      Cognition   Overall Cognitive Status  Within Functional Limits for tasks assessed      Sensation   Additional Comments  WFL      ROM / Strength   AROM / PROM / Strength  AROM;PROM      AROM  AROM Assessment Site  Ankle    Right/Left Ankle  Right    Right Ankle Dorsiflexion  0      PROM   PROM Assessment Site  Ankle    Right/Left Ankle  Right    Right Ankle Dorsiflexion  4      Palpation   Palpation comment  denies TTP on achilles tendon but does have pain with stretch      Ambulation/Gait   Gait Comments  in boot on Right, antalgic                Objective measurements completed on examination: See above findings.      [facility] Adult PT Treatment/Exercise - 11/12/18 0001      Modalities   Modalities  Ultrasound;Iontophoresis      Ultrasound   Ultrasound Location  Rt distal achilles insertion    Ultrasound Parameters  .6w/cm2 pulsed 8 min    Ultrasound Goals  Pain      Iontophoresis   Type of Iontophoresis  Dexamethasone    Location  Right distal achilles insertion    Dose  1cc    Time  6 hr wear      Manual Therapy   Manual therapy comments  pt would like to avoid DN as possible             PT Education - 11/12/18 1702    Education Details  anatomy of condition, POC, HEP, exercise form/rationale.  shoes, standing desk, use of boot/crutches    Person(s) Educated  Patient    Methods  Explanation;Demonstration;Verbal cues    Comprehension  Verbalized understanding;Returned demonstration;Verbal cues required;Need further instruction          PT Long Term Goals - 11/12/18 1656      PT LONG TERM GOAL #1   Title  DF to 10 deg    Baseline  4 at eval    Time  6    Period  Weeks    Status  New    Target Date  12/28/18      PT LONG TERM GOAL #2   Title  Pt will be able to perform light jogging to return to PLOF    Baseline  unable due to pain at eval    Time  6    Period  Weeks    Status  New    Target Date  12/28/18      PT LONG TERM GOAL #3   Title  pt will be independent in long term HEP for management of Haglunds deformity    Baseline  will progress and establish as appropriate    Time  6    Period  Weeks    Status  New    Target Date  12/28/18      PT LONG TERM GOAL #4   Title  Pt will demo good balance control on single leg and in plyometric motions to decrease stress on achilles    Baseline  unable at eval    Time  6    Period  Weeks    Status  New    Target Date  12/28/18             Plan - 11/12/18 1647    Clinical Impression Statement  Pt presents to PT with complaints of pain at distal insertion of Right achilles tendon that began last week. HE was seen for Haglunds deformity and similar pain on the left side abot 6 months ago.  Rt side with limited ROM and pain with weight bearing. I asked that he wears the boot at night but use crutches with tennis shoes during the day to conserve ROM and encourage natural motion of ankle in weight bearing phase of gait. Flexible arch noted and provided pt with paper for tennis shoes stating the need for moderate motion control and arch support. Discussed that he does not need to stop running altogether but the motion is going to be an aggrivating factor as will tennis- it is important that he continue HEP and care for  ankles in the long term and find other avenues of exercise. Pt verbalized understanding.     Personal Factors and Comorbidities  Comorbidity 1    Comorbidities  Haglunds deformity    Examination-Activity Limitations  Stairs;Stand;Other;Sleep   gait   Examination-Participation Restrictions  --   fitness routine, hobbies- tennis, ambulation   Stability/Clinical Decision Making  Stable/Uncomplicated    Clinical Decision Making  Low    Rehab Potential  Good    PT Frequency  2x / week    PT Duration  6 weeks    PT Treatment/Interventions  ADLs/Self Care Home Management;Cryotherapy;Ultrasound;Moist Heat;Iontophoresis /ml Dexamethasone;Electrical Stimulation;Gait training;Stair training;Functional mobility training;Therapeutic activities;Patient/family education;Neuromuscular re-education;Balance training;Therapeutic exercise;Manual techniques;Dry needling;Passive range of motion;Taping    PT Next Visit Plan  outcome of US/ionto?    PT Home Exercise Plan  ankle ABC, DF stretch, ice    Recommended Other Services  tennis shoes, crutches    Consulted and Agree with Plan of Care  Patient       Patient will benefit from skilled therapeutic intervention in order to improve the following deficits and impairments:  Abnormal gait, Pain, Increased muscle spasms, Decreased activity tolerance, Decreased range of motion, Difficulty walking, Decreased balance, Impaired flexibility  Visit Diagnosis: Pain in right ankle and joints of right foot - Plan: PT plan of care cert/re-cert  Difficulty in walking, not elsewhere classified - Plan: PT plan of care cert/re-cert     Problem List Patient Active Problem List   Diagnosis Date Noted  . Constipation 05/06/2017    Keyonta Madrid C. Adira Limburg PT, [name] 11/12/18 5:10 PM   [facility] [address] [place], [place], [address] Phone: [phone]   Fax:  [phone]  Name: Buster Schueller MRN: [id number] Date  of Birth: 10-26-1965

## 2018-11-16 ENCOUNTER — Encounter: Payer: Self-pay | Admitting: Physical Therapy

## 2018-11-16 ENCOUNTER — Ambulatory Visit: Payer: 59 | Admitting: Physical Therapy

## 2018-11-16 ENCOUNTER — Other Ambulatory Visit: Payer: Self-pay

## 2018-11-16 DIAGNOSIS — R262 Difficulty in walking, not elsewhere classified: Secondary | ICD-10-CM | POA: Diagnosis not present

## 2018-11-16 DIAGNOSIS — M25571 Pain in right ankle and joints of right foot: Secondary | ICD-10-CM

## 2018-11-16 DIAGNOSIS — M25572 Pain in left ankle and joints of left foot: Secondary | ICD-10-CM | POA: Diagnosis not present

## 2018-11-16 NOTE — Therapy (Signed)
[facility] [address] [place], [name], [address] Phone: [phone]   Fax:  [phone]  Physical Therapy Treatment  Patient Details  Name: Blake Rhodes MRN: [id number] Date of Birth: [birth year] Referring Provider (PT): Sky LakeShepperson, Kirstin, PA-C (Dr Farris HasKramer)   Encounter Date: 11/16/2018  PT End of Session - 11/16/18 0921    Visit Number  2    Number of Visits  13    Date for PT Re-Evaluation  12/28/18    PT Start Time  0916   pt arrived late   PT Stop Time  0948    PT Time Calculation (min)  32 min    Activity Tolerance  Patient tolerated treatment well    Behavior During Therapy  [facility] for tasks assessed/performed       History reviewed. No pertinent past medical history.  History reviewed. No pertinent surgical history.  There were no vitals filed for this visit.  Subjective Assessment - 11/16/18 0919    Subjective  pain has subsided some. walking with shoes & crutches, mostly pain around the edges. did not wear boot last night to bed so it hurts a little more on the side. ice helps.     Currently in Pain?  Yes    Pain Score  4     Pain Location  Heel    Pain Orientation  Right;Lateral                       [facility] Adult PT Treatment/Exercise - 11/16/18 0001      Ultrasound   Ultrasound Location  Rt distal acilles insertion    Ultrasound Parameters  .6w/cm2 pulsed    Ultrasound Goals  Pain      Iontophoresis   Type of Iontophoresis  Dexamethasone    Location  Right distal achilles insertion    Dose  1cc    Time  6 hr wear      Manual Therapy   Manual Therapy  Soft tissue mobilization;Passive ROM    Soft tissue mobilization  IASTM Rt gastroc    Passive ROM  rt ankle eversion                  PT Long Term Goals - 11/12/18 1656      PT LONG TERM GOAL #1   Title  DF to 10 deg    Baseline  4 at eval    Time  6    Period  Weeks    Status  New    Target Date  12/28/18      PT  LONG TERM GOAL #2   Title  Pt will be able to perform light jogging to return to PLOF    Baseline  unable due to pain at eval    Time  6    Period  Weeks    Status  New    Target Date  12/28/18      PT LONG TERM GOAL #3   Title  pt will be independent in long term HEP for management of Haglunds deformity    Baseline  will progress and establish as appropriate    Time  6    Period  Weeks    Status  New    Target Date  12/28/18      PT LONG TERM GOAL #4   Title  Pt will demo good balance control on single leg and in plyometric motions to decrease stress on achilles  Baseline  unable at eval    Time  6    Period  Weeks    Status  New    Target Date  12/28/18            Plan - 11/16/18 0956    Clinical Impression Statement  Overall some decrease in pain noted with switch to crutches to decrease weight bearing. Limited flexibility in ankle eversion so that was stretched passively to avoid increasing lateral ankle irritation. Discussed continuing HEP at home as the main goal right now is to decrease overall pain and slowly work back into exercises/gait as tolerated.     PT Treatment/Interventions  ADLs/Self Care Home Management;Cryotherapy;Ultrasound;Moist Heat;Iontophoresis 4mg /ml Dexamethasone;Electrical Stimulation;Gait training;Stair training;Functional mobility training;Therapeutic activities;Patient/family education;Neuromuscular re-education;Balance training;Therapeutic exercise;Manual techniques;Dry needling;Passive range of motion;Taping    PT Next Visit Plan  continue modalities & manual therapy, static weight bearing    PT Home Exercise Plan  ankle ABC, DF stretch, ice    Consulted and Agree with Plan of Care  Patient       Patient will benefit from skilled therapeutic intervention in order to improve the following deficits and impairments:  Abnormal gait, Pain, Increased muscle spasms, Decreased activity tolerance, Decreased range of motion, Difficulty walking,  Decreased balance, Impaired flexibility  Visit Diagnosis: Pain in right ankle and joints of right foot  Difficulty in walking, not elsewhere classified  Pain in left ankle and joints of left foot     Problem List Patient Active Problem List   Diagnosis Date Noted  . Constipation 05/06/2017    Oksana Deberry C. Sufyaan Palma PT, [name] 11/16/18 9:58 AM   [facility] [address] [place], [place], [address] Phone: [phone]   Fax:  [phone]  Name: Blake Rhodes MRN: [id number] Date of Birth: Aug 27, 1965

## 2018-11-19 ENCOUNTER — Encounter: Payer: Self-pay | Admitting: Physical Therapy

## 2018-11-19 ENCOUNTER — Other Ambulatory Visit: Payer: Self-pay

## 2018-11-19 ENCOUNTER — Ambulatory Visit: Payer: 59 | Admitting: Physical Therapy

## 2018-11-19 DIAGNOSIS — M25572 Pain in left ankle and joints of left foot: Secondary | ICD-10-CM | POA: Diagnosis not present

## 2018-11-19 DIAGNOSIS — M25571 Pain in right ankle and joints of right foot: Secondary | ICD-10-CM

## 2018-11-19 DIAGNOSIS — R262 Difficulty in walking, not elsewhere classified: Secondary | ICD-10-CM | POA: Diagnosis not present

## 2018-11-19 NOTE — Therapy (Signed)
[facility] [address] [place], [name], [address] Phone: [phone]   Fax:  [phone]  Physical Therapy Treatment  Patient Details  Name: Blake GhaziBakul Knappenberger MRN: [id number] Date of Birth: 27-Jun-1966 Referring Provider (PT): [place]irstin, [place] (Dr Farris HasKramer)   Encounter Date: 11/19/2018  PT End of Session - 11/19/18 1523    Visit Number  3    Number of Visits  13    Date for PT Re-Evaluation  12/28/18    PT Start Time  1519   pt arrived late   PT Stop Time  1554    PT Time Calculation (min)  35 min    Activity Tolerance  Patient tolerated treatment well    Behavior During Therapy  [facility] for tasks assessed/performed       History reviewed. No pertinent past medical history.  History reviewed. No pertinent surgical history.  There were no vitals filed for this visit.  Subjective Assessment - 11/19/18 1520    Subjective  Tolerable to walk with shoes on, not without.     Patient Stated Goals  decrease pain, walk, play tennis    Currently in Pain?  Yes    Pain Score  3     Pain Location  Heel    Pain Orientation  Right;Lateral    Pain Descriptors / Indicators  Sharp    Aggravating Factors   walking    Pain Relieving Factors  ice                       [facility] Adult PT Treatment/Exercise - 11/19/18 0001      Exercises   Exercises  Ankle      Ultrasound   Ultrasound Location  Rt distal achilles insertion- focus on lateral aspect    Ultrasound Parameters  .6 w/cm2 pulsed    Ultrasound Goals  Pain      Iontophoresis   Type of Iontophoresis  Dexamethasone    Location  Right distal achilles insertion    Dose  1cc    Time  6 hr wear      Manual Therapy   Manual Therapy  Joint mobilization    Joint Mobilization  navicular mobs gr 4    Passive ROM  Rt ankle eversion, DF      Ankle Exercises: Stretches   Gastroc Stretch Limitations  by PT      Ankle Exercises: Seated   Other Seated Ankle Exercises   4-way isometrics    Other Seated Ankle Exercises  4-way with tband             PT Education - 11/19/18 1604    Education Details  boot wear, progressing gait, HEP    Person(s) Educated  Patient    Methods  Explanation;Demonstration;Tactile cues;Verbal cues    Comprehension  Verbalized understanding;Verbal cues required;Returned demonstration;Tactile cues required;Need further instruction          PT Long Term Goals - 11/12/18 1656      PT LONG TERM GOAL #1   Title  DF to 10 deg    Baseline  4 at eval    Time  6    Period  Weeks    Status  New    Target Date  12/28/18      PT LONG TERM GOAL #2   Title  Pt will be able to perform light jogging to return to PLOF    Baseline  unable due to pain at eval  Time  6    Period  Weeks    Status  New    Target Date  12/28/18      PT LONG TERM GOAL #3   Title  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 Asked him to use red band for 4-way ankle at home to progress and to walk to tolerance. Will try sleeping in boot to decrease pm pain.     PT Treatment/Interventions  ADLs/Self Care Home Management;Cryotherapy;Ultrasound;Moist Heat;Iontophoresis 4mg /ml Dexamethasone;Electrical Stimulation;Gait training;Stair training;Functional mobility training;Therapeutic activities;Patient/family education;Neuromuscular re-education;Balance training;Therapeutic exercise;Manual techniques;Dry needling;Passive range  of motion;Taping    PT Next Visit Plan  continue modalities & manual therapy, increase OKC exercises as tolerated & progress weight acceptance    PT Home Exercise Plan  ankle ABC, DF stretch, ice, ankle 4-way red band    Consulted and Agree with Plan of Care  Patient       Patient will benefit from skilled therapeutic intervention in order to improve the following deficits and impairments:  Abnormal gait, Pain, Increased muscle spasms, Decreased activity tolerance, Decreased range of motion, Difficulty walking, Decreased balance, Impaired flexibility  Visit Diagnosis: Pain in right ankle and joints of right foot  Difficulty in walking, not elsewhere classified  Pain in left ankle and joints of left foot     Problem List Patient Active Problem List   Diagnosis Date Noted  . Constipation 05/06/2017    Kewana Sanon C. Bricelyn Freestone PT, [name] 11/19/18 4:09 PM   [facility] [address] [place], [place], [address] Phone: [phone]   Fax:  [phone]  Name: Uzay Louderback MRN: [id number] Date of Birth: 14-Dec-1965

## 2018-11-21 ENCOUNTER — Ambulatory Visit: Payer: 59 | Admitting: Physical Therapy

## 2018-11-22 ENCOUNTER — Other Ambulatory Visit: Payer: Self-pay

## 2018-11-22 ENCOUNTER — Encounter: Payer: Self-pay | Admitting: Physical Therapy

## 2018-11-22 ENCOUNTER — Ambulatory Visit: Payer: 59 | Admitting: Physical Therapy

## 2018-11-22 DIAGNOSIS — M25572 Pain in left ankle and joints of left foot: Secondary | ICD-10-CM | POA: Diagnosis not present

## 2018-11-22 DIAGNOSIS — R262 Difficulty in walking, not elsewhere classified: Secondary | ICD-10-CM | POA: Diagnosis not present

## 2018-11-22 DIAGNOSIS — M25571 Pain in right ankle and joints of right foot: Secondary | ICD-10-CM | POA: Diagnosis not present

## 2018-11-22 NOTE — Therapy (Signed)
[facility] [address] [place], [name], [address] Phone: [phone]   Fax:  [phone]  Physical Therapy Treatment  Patient Details  Name: Blake GhaziBakul Rhodes MRN: [id number] Date of Birth: 1965/09/26 Referring Provider (PT): [place], [place]irstin, [place] (Dr Farris HasKramer)   Encounter Date: 11/22/2018  PT End of Session - 11/22/18 1606    Visit Number  4    Number of Visits  13    Date for PT Re-Evaluation  12/28/18    PT Start Time  1530    PT Stop Time  1600    PT Time Calculation (min)  30 min    Activity Tolerance  Patient tolerated treatment well    Behavior During Therapy  [facility] for tasks assessed/performed       History reviewed. No pertinent past medical history.  History reviewed. No pertinent surgical history.  There were no vitals filed for this visit.  Subjective Assessment - 11/22/18 1604    Subjective  No pain today, none since removing ionto patch from last visit. only discomfort noted on either side of the heel when it is there.     Currently in Pain?  No/denies                       [facility] Treatment/Exercise - 11/22/18 0001      Manual Therapy   Manual Therapy  Taping    Joint Mobilization  eversion in sidelying grade 4    Kinesiotex  Facilitate Muscle      Kinesiotix   Facilitate Muscle   Rt peroneals      Ankle Exercises: Standing   Other Standing Ankle Exercises  gait training- roll over center of foot, central heel strike      Ankle Exercises: Aerobic   Elliptical  5 min             PT Education - 11/22/18 1605    Education Details  natural vs motion support shoe; gait pattern; importance of stretching with exercise    Person(s) Educated  Patient    Methods  Explanation;Demonstration;Verbal cues    Comprehension  Verbalized understanding;Returned demonstration;Verbal cues required;Need further instruction          PT Long Term Goals - 11/12/18 1656      PT LONG  TERM GOAL #1   Title  DF to 10 deg    Baseline  4 at eval    Time  6    Period  Weeks    Status  New    Target Date  12/28/18      PT LONG TERM GOAL #2   Title  Pt will be able to perform light jogging to return to PLOF    Baseline  unable due to pain at eval    Time  6    Period  Weeks    Status  New    Target Date  12/28/18      PT LONG TERM GOAL #3   Title  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 Placed K-tape along line of peroneals with 50% stretch for tactile cuing.     PT Treatment/Interventions  ADLs/Self Care Home Management;Cryotherapy;Ultrasound;Moist Heat;Iontophoresis 4mg /ml Dexamethasone;Electrical Stimulation;Gait training;Stair training;Functional mobility training;Therapeutic activities;Patient/family education;Neuromuscular re-education;Balance training;Therapeutic exercise;Manual techniques;Dry needling;Passive range of motion;Taping    PT Next Visit Plan  progress depending on outcome- no modalities today, add eversion strengthening    PT Home Exercise Plan  ankle ABC, DF stretch, ice, ankle 4-way red band    Consulted and Agree with Plan of Care  Patient       Patient will benefit from skilled therapeutic intervention in order to improve the following deficits and impairments:  Abnormal gait, Pain, Increased muscle spasms, Decreased activity tolerance, Decreased range of motion,  Difficulty walking, Decreased balance, Impaired flexibility  Visit Diagnosis: Pain in right ankle and joints of right foot  Difficulty in walking, not elsewhere classified     Problem List Patient Active Problem List   Diagnosis Date Noted  . Constipation 05/06/2017   [name] C. Oluwafemi Villella PT, [name] 11/22/18 4:12 PM   [facility] [address] [place], [place], [address] Phone: [phone]   Fax:  [phone]  Name: Blake Rhodes MRN: [id number] Date of Birth: 01/13/1966

## 2018-11-27 ENCOUNTER — Ambulatory Visit: Payer: 59 | Admitting: Physical Therapy

## 2018-11-27 ENCOUNTER — Other Ambulatory Visit: Payer: Self-pay

## 2018-11-27 ENCOUNTER — Encounter: Payer: Self-pay | Admitting: Physical Therapy

## 2018-11-27 DIAGNOSIS — M25571 Pain in right ankle and joints of right foot: Secondary | ICD-10-CM

## 2018-11-27 DIAGNOSIS — R262 Difficulty in walking, not elsewhere classified: Secondary | ICD-10-CM | POA: Diagnosis not present

## 2018-11-27 DIAGNOSIS — M25572 Pain in left ankle and joints of left foot: Secondary | ICD-10-CM | POA: Diagnosis not present

## 2018-11-27 NOTE — Therapy (Signed)
[facility] [address] [place], [place], [address] Phone: [phone]   Fax:  [phone]  Physical Therapy Treatment  Patient Details  Name: Blake Rhodes MRN: [id number] Date of Birth: 06-22-1966 Referring Provider (PT): [place], Kirstin, [place] (Dr Farris Has)   Encounter Date: 11/27/2018  PT End of Session - 11/27/18 1554    Visit Number  5    Number of Visits  13    Date for PT Re-Evaluation  12/28/18    PT Start Time  1526    PT Stop Time  1553    PT Time Calculation (min)  27 min    Activity Tolerance  Patient tolerated treatment well    Behavior During Therapy  [facility] for tasks assessed/performed       History reviewed. No pertinent past medical history.  History reviewed. No pertinent surgical history.  There were no vitals filed for this visit.  Subjective Assessment - 11/27/18 1554    Subjective  Very mild discomfort on sides of heels. Went for a long walk yesterday. Calf felt like it was tight and I had to roll it out.     Patient Stated Goals  decrease pain, walk, play tennis                       [facility] Adult PT Treatment/Exercise - 11/27/18 0001      Exercises   Exercises  Ankle      Ultrasound   Ultrasound Location  Rt distal achilles    Ultrasound Parameters  .6w/cm2 pulsed    Ultrasound Goals  Pain      Ankle Exercises: Stretches   Slant Board Stretch  2 reps;30 seconds      Ankle Exercises: Standing   Heel Raises Limitations  eccentric heel raise edge of step    Other Standing Ankle Exercises  retro stepping    Other Standing Ankle Exercises  side stepping                  PT Long Term Goals - 11/12/18 1656      PT LONG TERM GOAL #1   Title  DF to 10 deg    Baseline  4 at eval    Time  6    Period  Weeks    Status  New    Target Date  12/28/18      PT LONG TERM GOAL #2   Title  Pt will be able to perform light jogging to return to PLOF    Baseline  unable due  to pain at eval    Time  6    Period  Weeks    Status  New    Target Date  12/28/18      PT LONG TERM GOAL #3   Title  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 If this goes well, will add some plyometrics in at next appointment. Is planning to go for another long walk tonight.     PT Treatment/Interventions  ADLs/Self Care Home Management;Cryotherapy;Ultrasound;Moist Heat;Iontophoresis 4mg /ml Dexamethasone;Electrical Stimulation;Gait training;Stair training;Functional mobility training;Therapeutic activities;Patient/family education;Neuromuscular re-education;Balance training;Therapeutic exercise;Manual techniques;Dry needling;Passive range of motion;Taping    PT Next Visit Plan  eversion strengthening, light plyo if tolerated    PT Home Exercise Plan  ankle ABC, DF stretch, ice, ankle 4-way red band; eccentric heel raise, retro stepping, side stepping    Consulted and Agree with Plan of Care  Patient       Patient will benefit from skilled therapeutic intervention in order to improve the following deficits and impairments:  Abnormal gait, Pain, Increased muscle spasms, Decreased activity tolerance, Decreased range of motion, Difficulty walking, Decreased balance, Impaired flexibility  Visit Diagnosis: Pain in right ankle and joints of right foot  Difficulty in walking, not elsewhere classified  Pain in left ankle and  joints of left foot     Problem List Patient Active Problem List   Diagnosis Date Noted  . Constipation 05/06/2017    Tanijah Morais C. Tressy Kunzman PT, [name] 11/27/18 4:22 PM   [facility] [address] [place], [name], [address] Phone: [phone]   Fax:  [phone]  Name: Blake Rhodes MRN: [id number] Date of Birth: 11/11/1965

## 2018-11-30 ENCOUNTER — Ambulatory Visit: Payer: 59 | Admitting: Physical Therapy

## 2018-12-03 ENCOUNTER — Encounter: Payer: Self-pay | Admitting: Physical Therapy

## 2018-12-03 ENCOUNTER — Ambulatory Visit: Payer: 59 | Attending: Physician Assistant | Admitting: Physical Therapy

## 2018-12-03 ENCOUNTER — Other Ambulatory Visit: Payer: Self-pay

## 2018-12-03 DIAGNOSIS — R262 Difficulty in walking, not elsewhere classified: Secondary | ICD-10-CM | POA: Diagnosis not present

## 2018-12-03 DIAGNOSIS — M25572 Pain in left ankle and joints of left foot: Secondary | ICD-10-CM | POA: Diagnosis not present

## 2018-12-03 DIAGNOSIS — M25571 Pain in right ankle and joints of right foot: Secondary | ICD-10-CM | POA: Diagnosis not present

## 2018-12-03 NOTE — Therapy (Signed)
[facility] [address] [place], [name], [address] Phone: [phone]   Fax:  [phone]  Physical Therapy Treatment  Patient Details  Name: Blake GhaziBakul Rhodes MRN: [id number] Date of Birth: [birth year] Referring Provider (PT): [place], [place]irstin, [place] (Dr Farris HasKramer)   Encounter Date: 12/03/2018  PT End of Session - 12/03/18 1230    Visit Number  6    Number of Visits  13    Date for PT Re-Evaluation  12/28/18    PT Start Time  1230   pt arrived late   PT Stop Time  1256    PT Time Calculation (min)  26 min    Activity Tolerance  Patient tolerated treatment well    Behavior During Therapy  [facility] for tasks assessed/performed       History reviewed. No pertinent past medical history.  History reviewed. No pertinent surgical history.  There were no vitals filed for this visit.  Subjective Assessment - 12/03/18 1230    Subjective  Still has some pain at insertion of achilles from midline to lateral aspect. Able to do longer walks.     Currently in Pain?  Yes    Pain Score  2     Pain Location  Ankle    Pain Orientation  Right;Lateral                       [facility] Adult PT Treatment/Exercise - 12/03/18 0001      Ankle Exercises: Plyometrics   Plyometric Exercises  small hopping    Plyometric Exercises  square hops      Ankle Exercises: Standing   Heel Raises Limitations  in chair sit at wall    Side Shuffle (Round Trip)  red tband at forefoot    Other Standing Ankle Exercises  squats on toes      Ankle Exercises: Stretches   Gastroc Stretch  2 reps;30 seconds      Ankle Exercises: Seated   Other Seated Ankle Exercises  yellow tband PF+eversion                  PT Long Term Goals - 11/12/18 1656      PT LONG TERM GOAL #1   Title  DF to 10 deg    Baseline  4 at eval    Time  6    Period  Weeks    Status  New    Target Date  12/28/18      PT LONG TERM GOAL #2   Title  Pt will be able to  perform light jogging to return to PLOF    Baseline  unable due to pain at eval    Time  6    Period  Weeks    Status  New    Target Date  12/28/18      PT LONG TERM GOAL #3   Title  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 Began adding plyometrics today without pain. Pt feels that stretching is helpful and has not been doing so in the AM- will try stretching AM & PM and icing after walking and will return to ionto at that point if lateral pain has not resolved.     PT Treatment/Interventions  ADLs/Self Care Home Management;Cryotherapy;Ultrasound;Moist Heat;Iontophoresis 4mg /ml Dexamethasone;Electrical Stimulation;Gait training;Stair training;Functional mobility training;Therapeutic activities;Patient/family education;Neuromuscular re-education;Balance training;Therapeutic exercise;Manual techniques;Dry needling;Passive range of motion;Taping    PT Next Visit Plan  progress plyo, run if 3 days no pain    PT Home Exercise Plan  ankle ABC, DF stretch, ice, ankle 4-way red band; eccentric heel raise, retro stepping, side stepping with tband, wall sit with heel raises, hopping, long sitting PF+eversion    Consulted and Agree with Plan of Care  Patient       Patient will benefit from skilled therapeutic intervention in order to improve the following deficits and impairments:  Abnormal gait, Pain, Increased muscle spasms, Decreased activity tolerance, Decreased range of motion,  Difficulty walking, Decreased balance, Impaired flexibility  Visit Diagnosis: Pain in right ankle and joints of right foot  Difficulty in walking, not elsewhere classified  Pain in left ankle and joints of left foot     Problem List Patient Active Problem List   Diagnosis Date Noted  . Constipation 05/06/2017   Jamarie Joplin C. Kimberley Speece PT, [name] 12/03/18 1:03 PM   [facility] [address] [place], [place], [address] Phone: [phone]   Fax:  [phone]  Name: Blake Rhodes MRN: [id number] Date of Birth: 28-Dec-1965

## 2018-12-03 NOTE — Patient Instructions (Signed)
Access Code: [email or web address]  URL: [email or web address]  Date: 12/03/2018  Prepared by: Army Fossa   Exercises  Side Stepping with Resistance at Feet - 10 reps - 3 sets - 1x daily - 7x weekly  Wall Squat with Swiss Ball and Heel Raises - 10 reps - 3 sets - 1x daily - 7x weekly  Squat Jumps - 10 reps - 3 sets - 1x daily - 7x weekly  Long Sitting Ankle Eversion with Resistance - 10 reps - 3 sets - 1x daily - 7x weekly

## 2019-01-03 ENCOUNTER — Ambulatory Visit: Payer: 59 | Attending: Physician Assistant | Admitting: Physical Therapy

## 2019-01-03 ENCOUNTER — Encounter: Payer: Self-pay | Admitting: Physical Therapy

## 2019-01-03 ENCOUNTER — Other Ambulatory Visit: Payer: Self-pay

## 2019-01-03 DIAGNOSIS — M25571 Pain in right ankle and joints of right foot: Secondary | ICD-10-CM | POA: Insufficient documentation

## 2019-01-03 DIAGNOSIS — R262 Difficulty in walking, not elsewhere classified: Secondary | ICD-10-CM | POA: Insufficient documentation

## 2019-01-03 NOTE — Therapy (Addendum)
[place], [place], [address] Phone: [phone]   Fax:  [phone]  Physical Therapy Evaluation/ERO/Discharge  Patient Details  Name: Blake Rhodes MRN: [id number] Date of Birth: [birth year] Referring Provider (PT): [place], [place], [place] (Dr Alfonso Ramus)   Encounter Date: 01/03/2019  PT End of Session - 01/03/19 1620    Visit Number  7    Number of Visits  13    Date for PT Re-Evaluation  01/24/19    PT Start Time  5956    PT Stop Time  1610    PT Time Calculation (min)  25 min    Activity Tolerance  Patient tolerated treatment well    Behavior During Therapy  [facility] for tasks assessed/performed       History reviewed. No pertinent past medical history.  History reviewed. No pertinent surgical history.  There were no vitals filed for this visit.   Subjective Assessment - 01/03/19 1618    Subjective  Have been doing a walk/run in my neighborhood. Sitting for long hours. Monday began feeling Rt lateral heel pain; rested, stretched and iced so it is feeling better today but I still notice it. Tried rolling but lower part of my calf was very tender.     Patient Stated Goals  decrease pain, walk, play tennis    Currently in Pain?  Yes    Pain Score  --   mild today   Pain Location  Heel    Pain Orientation  Right;Lateral    Pain Descriptors / Indicators  Aching    Aggravating Factors   running    Pain Relieving Factors  stretching, ice, rest         [facility] PT Assessment - 01/03/19 0001      Assessment   Medical Diagnosis  R achilles tendinits with haglund deformity    Referring Provider (PT)  Levester Fresh, Kirstin, PA-C   Dr Alfonso Ramus   Onset Date/Surgical Date  11/08/18      Precautions   Precaution Comments  Haglunds deformity      [place] residence    Living Arrangements  Spouse/significant other;Children    Type of [place]  in home      Prior Function   Level of Independence  Independent    Vocation  Full time employment    Vocation Requirements  sitting at desk    Leisure  running, tennis      Cognition   Overall Cognitive Status  Within Functional Limits for tasks assessed      PROM   Right Ankle Dorsiflexion  6      Palpation   Palpation comment  TTP lateral gastroc      Ambulation/Gait   Gait Comments  able to maintain WFL gait pattern                Objective measurements completed on examination: See above findings.      [facility] PT Treatment/Exercise - 01/03/19 0001      Manual Therapy   Joint Mobilization  ankle distraction    Soft tissue mobilization  IASTM Rt lateral gastroc    Passive ROM  DF stretching      Ankle Exercises: Stretches   Other Stretch  hamstring stretch with strap    Other Stretch  gastroc stretch long sitting in chair with strap; standing quad stretch  PT Education - 01/03/19 1620    Education Details  importance of regular stretching, proper form with lunges, getting up regularly    Person(s) Educated  Patient    Methods  Explanation;Demonstration;Verbal cues    Comprehension  Verbalized understanding;Need further instruction;Returned demonstration          PT Long Term Goals - 01/03/19 1624      PT LONG TERM GOAL #1   Title  DF to 10 deg    Baseline  <10 visually at re-eval    Status  On-going    Target Date  01/25/19      PT LONG TERM GOAL #2   Title  Pt will be able to perform light jogging to return to PLOF    Baseline  was able to for a short period but pain has begun to return    Status  On-going      PT LONG TERM GOAL #3   Title  pt will be independent in long term HEP for management of Haglunds deformity    Baseline  not complaint with stretching program    Status  On-going      PT LONG TERM GOAL #4   Title  Pt will demo good balance control on single leg and in plyometric motions to decrease stress on  achilles    Baseline  not evaluated today due to pain    Status  On-going             Plan - 01/03/19 1621    Clinical Impression Statement  Significant tightness noted today in Rt lateral gastroc. This tightness has not gotten to the point that joint mobility is comprimised but it is enough to aggrivate lateral ankle. Pt reports he sits for very long hours and has not been good about stretching regularly. Reiterated the importance of stretching daily and around exercise and encouraged him to set an alarm to go off every 45 min to move around/stretch. He has purchased a bike to decrease use of running for cardio. will return in 1 week to determine outcome/appropriate POC.     Comorbidities  Haglunds deformity    PT Frequency  1x / week    PT Duration  4 weeks    PT Treatment/Interventions  ADLs/Self Care Home Management;Cryotherapy;Ultrasound;Moist Heat;Iontophoresis 22m/ml Dexamethasone;Electrical Stimulation;Gait training;Stair training;Functional mobility training;Therapeutic activities;Patient/family education;Neuromuscular re-education;Balance training;Therapeutic exercise;Manual techniques;Dry needling;Passive range of motion;Taping    PT Home Exercise Plan  ankle ABC, DF stretch, ice, ankle 4-way red band; eccentric heel raise, retro stepping, side stepping with tband, wall sit with heel raises, hopping, long sitting PF+eversion; seated HSS with strap, standing quad stretch    Consulted and Agree with Plan of Care  Patient       Patient will benefit from skilled therapeutic intervention in order to improve the following deficits and impairments:  Abnormal gait, Pain, Increased muscle spasms, Decreased activity tolerance, Decreased range of motion, Difficulty walking, Decreased balance, Impaired flexibility  Visit Diagnosis: Pain in right ankle and joints of right foot  Difficulty in walking, not elsewhere classified     Problem List Patient Active Problem List   Diagnosis  Date Noted  . Constipation 05/06/2017   Shaley Leavens C. Ikaika Showers PT, [name] 01/03/19 4:35 PM   [facility]GCoward NAl[place] [address]Phone: [phone]  Fax:  [phone] Name: BAvett ReineckMRN: [id number] of Birth: 201/19/1967 PHYSICAL THERAPY DISCHARGE SUMMARY  Visits from Start of Care:  7  Current functional level related to goals / functional outcomes: See above   Remaining deficits: See above   Education / Equipment: Anatomy of condition, POC, HEP, exercise form/rationale  Plan: Patient agrees to discharge.  Patient goals were partially met. Patient is being discharged due to being pleased with the current functional level.  ?????     Linet Brash C. Hortense Cantrall PT, [name] 03/28/19 4:17 PM

## 2019-01-11 ENCOUNTER — Ambulatory Visit: Payer: 59 | Admitting: Physical Therapy

## 2019-03-25 ENCOUNTER — Other Ambulatory Visit: Payer: Self-pay

## 2019-03-25 ENCOUNTER — Encounter: Payer: Self-pay | Admitting: Family Medicine

## 2019-03-25 ENCOUNTER — Ambulatory Visit (INDEPENDENT_AMBULATORY_CARE_PROVIDER_SITE_OTHER): Payer: 59 | Admitting: Family Medicine

## 2019-03-25 VITALS — BP 122/74 | HR 62 | Temp 97.9°F | Ht 70.0 in | Wt 180.0 lb

## 2019-03-25 DIAGNOSIS — E663 Overweight: Secondary | ICD-10-CM

## 2019-03-25 DIAGNOSIS — M545 Low back pain, unspecified: Secondary | ICD-10-CM | POA: Insufficient documentation

## 2019-03-25 DIAGNOSIS — R8290 Unspecified abnormal findings in urine: Secondary | ICD-10-CM

## 2019-03-25 DIAGNOSIS — Z0001 Encounter for general adult medical examination with abnormal findings: Secondary | ICD-10-CM | POA: Diagnosis not present

## 2019-03-25 DIAGNOSIS — Z6825 Body mass index (BMI) 25.0-25.9, adult: Secondary | ICD-10-CM | POA: Diagnosis not present

## 2019-03-25 DIAGNOSIS — R0781 Pleurodynia: Secondary | ICD-10-CM

## 2019-03-25 DIAGNOSIS — Z13220 Encounter for screening for lipoid disorders: Secondary | ICD-10-CM | POA: Diagnosis not present

## 2019-03-25 DIAGNOSIS — Z23 Encounter for immunization: Secondary | ICD-10-CM

## 2019-03-25 DIAGNOSIS — Z1211 Encounter for screening for malignant neoplasm of colon: Secondary | ICD-10-CM | POA: Diagnosis not present

## 2019-03-25 LAB — URINALYSIS, ROUTINE W REFLEX MICROSCOPIC
Bilirubin Urine: NEGATIVE
Hgb urine dipstick: NEGATIVE
Ketones, ur: NEGATIVE
Leukocytes,Ua: NEGATIVE
Nitrite: NEGATIVE
RBC / HPF: NONE SEEN (ref 0–?)
Specific Gravity, Urine: <=1.005 — AB (ref 1.000–1.030)
Total Protein, Urine: NEGATIVE
Urine Glucose: NEGATIVE
Urobilinogen, UA: 0.2 (ref 0.0–1.0)
pH: 6.5 (ref 5.0–8.0)

## 2019-03-25 LAB — CBC
HCT: 45.7 % (ref 39.0–52.0)
Hemoglobin: 15.3 g/dL (ref 13.0–17.0)
MCHC: 33.6 g/dL (ref 30.0–36.0)
MCV: 84.4 fl (ref 78.0–100.0)
Platelets: 146 10*3/uL — ABNORMAL LOW (ref 150.0–400.0)
RBC: 5.41 Mil/uL (ref 4.22–5.81)
RDW: 12.8 % (ref 11.5–15.5)
WBC: 4.8 10*3/uL (ref 4.0–10.5)

## 2019-03-25 LAB — COMPREHENSIVE METABOLIC PANEL
ALT: 35 U/L (ref 0–53)
AST: 38 U/L — ABNORMAL HIGH (ref 0–37)
Albumin: 4.9 g/dL (ref 3.5–5.2)
Alkaline Phosphatase: 69 U/L (ref 39–117)
BUN: 7 mg/dL (ref 6–23)
CO2: 27 mEq/L (ref 19–32)
Calcium: 9.7 mg/dL (ref 8.4–10.5)
Chloride: 103 mEq/L (ref 96–112)
Creatinine, Ser: 1.03 mg/dL (ref 0.40–1.50)
GFR: 75.99 mL/min (ref >60.00)
Glucose, Bld: 97 mg/dL (ref 70–99)
Potassium: 4.2 mEq/L (ref 3.5–5.1)
Sodium: 138 mEq/L (ref 135–145)
Total Bilirubin: 0.7 mg/dL (ref 0.2–1.2)
Total Protein: 7 g/dL (ref 6.0–8.3)

## 2019-03-25 LAB — LIPID PANEL
Cholesterol: 162 mg/dL (ref 0–200)
HDL: 30.5 mg/dL — ABNORMAL LOW (ref >39.00)
NonHDL: 131.97
Total CHOL/HDL Ratio: 5
Triglycerides: 236 mg/dL — ABNORMAL HIGH (ref 0.0–149.0)
VLDL: 47.2 mg/dL — ABNORMAL HIGH (ref 0.0–40.0)

## 2019-03-25 LAB — LDL CHOLESTEROL, DIRECT: Direct LDL: 89 mg/dL

## 2019-03-25 MED ORDER — DICLOFENAC SODIUM 75 MG PO TBEC: 75.0000 mg | DELAYED_RELEASE_TABLET | Freq: Two times a day (BID) | ORAL | 0 refills | Status: DC

## 2019-03-25 NOTE — Progress Notes (Signed)
Chief Complaint:  Blake Rhodes is a 53 y.o. male who presents today for his annual comprehensive physical exam and to establish care.   Assessment/Plan:  Low back pain Continue management per PT.  Rib Pain No red flags.  Start diclofenac 75 mg twice daily for the next 1 to 2 weeks.  May need referral to sports medicine if no improvement.  Foul Smelling Urine No red flags.  Likely not related or possibly dehydration.  Will check UA.  Body mass index is 25.83 kg/m. / Overweight BMI Metric Follow Up - 03/25/19 1303      BMI Metric Follow Up-Please document annually   BMI Metric Follow Up  Education provided       Preventative Healthcare: We will order Cologuard.  Check CBC, C met, TSH, and lipid panel.  Flu vaccine given today.  Patient Counseling(The following topics were reviewed and/or handout was given):  -Nutrition: Stressed importance of moderation in sodium/caffeine intake, saturated fat and cholesterol, caloric balance, sufficient intake of fresh fruits, vegetables, and fiber.  -Stressed the importance of regular exercise.   -Substance Abuse: Discussed cessation/primary prevention of tobacco, alcohol, or other drug use; driving or other dangerous activities under the influence; availability of treatment for abuse.   -Injury prevention: Discussed safety belts, safety helmets, smoke detector, smoking near bedding or upholstery.   -Sexuality: Discussed sexually transmitted diseases, partner selection, use of condoms, avoidance of unintended pregnancy and contraceptive alternatives.   -Dental health: Discussed importance of regular tooth brushing, flossing, and dental visits.  -Health maintenance and immunizations reviewed. Please refer to Health maintenance section.  Return to care in 1 year for next preventative visit.     Subjective:  HPI:  He has no acute complaints today.   He has a few minor concerns today outlined below:  He has chronic low back pain.  Is been  seeing PT for this.  He has been doing well with this.  Is had sciatica in the past but no issues with that recently.  Over the last several weeks he has noticed increasing left upper quadrant abdominal pain.  This is worsening with certain positions.  Does a lot of yoga and notices that certain stretches make his symptoms much worse.  No associated nausea or vomiting.  No constipation or diarrhea.  No chest pain or shortness of breath.  Feels like he has an irritated rib.  No specific treatments tried.  He is also noticed some foul-smelling urine lately.  This is usually related to his diet however he is worried about potential infection.  No reported fevers or chills.  Lifestyle Diet: Vegetarian diet.  Exercise: Likes to walk and do biking. Tries to run. Walks every day.  Depression screen [facility] 2/9 03/25/2019  Decreased Interest 0  Down, Depressed, Hopeless 0  PHQ - 2 Score 0  Altered sleeping 0  Tired, decreased energy 1  Change in appetite 0  Feeling bad or failure about yourself  0  Trouble concentrating 0  Moving slowly or fidgety/restless 0  Suicidal thoughts 0  PHQ-9 Score 1  Difficult doing work/chores Not difficult at all    Health Maintenance Due  Topic Date Due  . HIV Screening  09/24/1982  . COLONOSCOPY  09/24/2017  . INFLUENZA VACCINE  03/02/2019     ROS: Per HPI, otherwise a complete review of systems was negative.   PMH:  The following were reviewed and entered/updated in epic: Past Medical History:  Diagnosis Date  . Acute low back  pain with sciatica   . Hyperlipidemia   . Sciatica bACK    Patient Active Problem List   Diagnosis Date Noted  . Low back pain 03/25/2019   Past Surgical History:  Procedure Laterality Date  . TONSILLECTOMY      Family History  Problem Relation Age of Onset  . Diabetes Mother   . Dementia Father   . High blood pressure Father   . Hypertension Father   . Mental illness Father   . Diabetes Maternal Grandfather   .  Hypertension Paternal Grandmother   . Heart disease Paternal Grandfather     Medications- reviewed and updated Current Outpatient Medications  Medication Sig Dispense Refill  . diclofenac (VOLTAREN) 75 MG EC tablet Take 1 tablet (75 mg total) by mouth 2 (two) times daily. 30 tablet 0   No current facility-administered medications for this visit.     Allergies-reviewed and updated No Known Allergies  Social History   Socioeconomic History  . Marital status: Married    Spouse name: Not on file  . Number of children: Not on file  . Years of education: Not on file  . Highest education level: Not on file  Occupational History  . Not on file  Social Needs  . Financial resource strain: Not on file  . Food insecurity    Worry: Not on file    Inability: Not on file  . Transportation needs    Medical: Not on file    Non-medical: Not on file  Tobacco Use  . Smoking status: Never Smoker  . Smokeless tobacco: Never Used  Substance and Sexual Activity  . Alcohol use: Yes    Alcohol/week: 2.0 standard drinks    Types: 2 Cans of beer per week    Comment: socially  . Drug use: No  . Sexual activity: Yes  Lifestyle  . Physical activity    Days per week: Not on file    Minutes per session: Not on file  . Stress: Not on file  Relationships  . Social Herbalist on phone: Not on file    Gets together: Not on file    Attends religious service: Not on file    Active member of club or organization: Not on file    Attends meetings of clubs or organizations: Not on file    Relationship status: Not on file  Other Topics Concern  . Not on file  Social History Narrative  . Not on file        Objective:  Physical Exam: BP 122/74   Pulse 62   Temp 97.9 F (36.6 C)   Ht 5' 10"  (1.778 m)   Wt 180 lb (81.6 kg)   SpO2 99%   BMI 25.83 kg/m   Body mass index is 25.83 kg/m. Wt Readings from Last 3 Encounters:  03/25/19 180 lb (81.6 kg)  05/06/17 176 lb (79.8 kg)    Gen: NAD, resting comfortably HEENT: TMs normal bilaterally. OP clear. No thyromegaly noted.  CV: RRR with no murmurs appreciated Pulm: NWOB, CTAB with no crackles, wheezes, or rhonchi GI: Normal bowel sounds present. Soft, Nontender, Nondistended. MSK: no edema, cyanosis, or clubbing noted. TTP along left lower rib wall.  Skin: warm, dry Neuro: CN2-12 grossly intact. Strength 5/5 in upper and lower extremities. Reflexes symmetric and intact bilaterally.  Psych: Normal affect and thought content     Simeon Vera M. Jerline Pain, MD 03/25/2019 1:05 PM

## 2019-03-25 NOTE — Patient Instructions (Signed)
It was very nice to see you today!  Please try taking the diclofenac for your rib pain. Let me know if not improving.   We will check blood work and a urine sample today.  We will set you up to have Cologuard.  Come back to see me in 1 year for your next physical, or sooner as needed.  Take care, Dr Jerline Pain  Please try these tips to maintain a healthy lifestyle:   Eat at least 3 REAL meals and 1-2 snacks per day.  Aim for no more than 5 hours between eating.  If you eat breakfast, please do so within one hour of getting up.    Obtain twice as many fruits/vegetables as protein or carbohydrate foods for both lunch and dinner. (Half of each meal should be fruits/vegetables, one quarter protein, and one quarter starchy carbs)   Cut down on sweet beverages. This includes juice, soda, and sweet tea.    Exercise at least 150 minutes every week.    Preventive Care 20-25 Years Old, Male Preventive care refers to lifestyle choices and visits with your health care provider that can promote health and wellness. This includes:  A yearly physical exam. This is also called an annual well check.  Regular dental and eye exams.  Immunizations.  Screening for certain conditions.  Healthy lifestyle choices, such as eating a healthy diet, getting regular exercise, not using drugs or products that contain nicotine and tobacco, and limiting alcohol use. What can I expect for my preventive care visit? Physical exam Your health care provider will check:  Height and weight. These may be used to calculate body mass index (BMI), which is a measurement that tells if you are at a healthy weight.  Heart rate and blood pressure.  Your skin for abnormal spots. Counseling Your health care provider may ask you questions about:  Alcohol, tobacco, and drug use.  Emotional well-being.  Home and relationship well-being.  Sexual activity.  Eating habits.  Work and work Statistician. What  immunizations do I need?  Influenza (flu) vaccine  This is recommended every year. Tetanus, diphtheria, and pertussis (Tdap) vaccine  You may need a Td booster every 10 years. Varicella (chickenpox) vaccine  You may need this vaccine if you have not already been vaccinated. Zoster (shingles) vaccine  You may need this after age 77. Measles, mumps, and rubella (MMR) vaccine  You may need at least one dose of MMR if you were born in 1957 or later. You may also need a second dose. Pneumococcal conjugate (PCV13) vaccine  You may need this if you have certain conditions and were not previously vaccinated. Pneumococcal polysaccharide (PPSV23) vaccine  You may need one or two doses if you smoke cigarettes or if you have certain conditions. Meningococcal conjugate (MenACWY) vaccine  You may need this if you have certain conditions. Hepatitis A vaccine  You may need this if you have certain conditions or if you travel or work in places where you may be exposed to hepatitis A. Hepatitis B vaccine  You may need this if you have certain conditions or if you travel or work in places where you may be exposed to hepatitis B. Haemophilus influenzae type b (Hib) vaccine  You may need this if you have certain risk factors. Human papillomavirus (HPV) vaccine  If recommended by your health care provider, you may need three doses over 6 months. You may receive vaccines as individual doses or as more than one vaccine together in  one shot (combination vaccines). Talk with your health care provider about the risks and benefits of combination vaccines. What tests do I need? Blood tests  Lipid and cholesterol levels. These may be checked every 5 years, or more frequently if you are over 29 years old.  Hepatitis C test.  Hepatitis B test. Screening  Lung cancer screening. You may have this screening every year starting at age 89 if you have a 30-pack-year history of smoking and currently smoke  or have quit within the past 15 years.  Prostate cancer screening. Recommendations will vary depending on your family history and other risks.  Colorectal cancer screening. All adults should have this screening starting at age 55 and continuing until age 82. Your health care provider may recommend screening at age 31 if you are at increased risk. You will have tests every 1-10 years, depending on your results and the type of screening test.  Diabetes screening. This is done by checking your blood sugar (glucose) after you have not eaten for a while (fasting). You may have this done every 1-3 years.  Sexually transmitted disease (STD) testing. Follow these instructions at home: Eating and drinking  Eat a diet that includes fresh fruits and vegetables, whole grains, lean protein, and low-fat dairy products.  Take vitamin and mineral supplements as recommended by your health care provider.  Do not drink alcohol if your health care provider tells you not to drink.  If you drink alcohol: ? Limit how much you have to 0-2 drinks a day. ? Be aware of how much alcohol is in your drink. In the [place] one drink equals one 12 oz bottle of beer (355 mL), one 5 oz glass of wine (148 mL), or one 1 oz glass of hard liquor (44 mL). Lifestyle  Take daily care of your teeth and gums.  Stay active. Exercise for at least 30 minutes on 5 or more days each week.  Do not use any products that contain nicotine or tobacco, such as cigarettes, e-cigarettes, and chewing tobacco. If you need help quitting, ask your health care provider.  If you are sexually active, practice safe sex. Use a condom or other form of protection to prevent STIs (sexually transmitted infections).  Talk with your health care provider about taking a low-dose aspirin every day starting at age 68. What's next?  Go to your health care provider once a year for a well check visit.  Ask your health care provider how often you should have  your eyes and teeth checked.  Stay up to date on all vaccines. This information is not intended to replace advice given to you by your health care provider. Make sure you discuss any questions you have with your health care provider. Document Released: 08/14/2015 Document Revised: 07/12/2018 Document Reviewed: 07/12/2018 Elsevier Patient Education  2020 [employer].

## 2019-03-25 NOTE — Assessment & Plan Note (Signed)
Continue management per PT.

## 2019-03-26 ENCOUNTER — Encounter: Payer: Self-pay | Admitting: Family Medicine

## 2019-03-26 DIAGNOSIS — E785 Hyperlipidemia, unspecified: Secondary | ICD-10-CM | POA: Insufficient documentation

## 2019-03-26 LAB — TSH: TSH: 1.26 u[IU]/mL (ref 0.35–4.50)

## 2019-03-26 NOTE — Progress Notes (Signed)
Please inform patient of the following:  His triglycerides were a bit high and his "good" cholesterol was a bit low but everything else looked good. Do not need to start medications at this time. He should continue working on diet and exercise and we can recheck in a year or so.  Algis Greenhouse. Jerline Pain, MD 03/26/2019 11:23 AM

## 2019-03-27 ENCOUNTER — Encounter: Payer: Self-pay | Admitting: Family Medicine

## 2019-04-02 ENCOUNTER — Ambulatory Visit: Payer: 59 | Admitting: Family Medicine

## 2019-04-30 ENCOUNTER — Encounter: Payer: Self-pay | Admitting: Family Medicine

## 2019-05-02 ENCOUNTER — Other Ambulatory Visit: Payer: Self-pay

## 2019-05-02 DIAGNOSIS — M5440 Lumbago with sciatica, unspecified side: Secondary | ICD-10-CM

## 2019-05-03 ENCOUNTER — Other Ambulatory Visit: Payer: Self-pay

## 2019-05-03 ENCOUNTER — Encounter: Payer: Self-pay | Admitting: Physical Therapy

## 2019-05-03 ENCOUNTER — Ambulatory Visit: Payer: 59 | Attending: Family Medicine | Admitting: Physical Therapy

## 2019-05-03 DIAGNOSIS — M25551 Pain in right hip: Secondary | ICD-10-CM | POA: Insufficient documentation

## 2019-05-03 DIAGNOSIS — M5440 Lumbago with sciatica, unspecified side: Secondary | ICD-10-CM | POA: Diagnosis not present

## 2019-05-03 NOTE — Therapy (Signed)
[facility] [address] [place], [place], [address] Phone: [phone]   Fax:  [phone]  Physical Therapy Evaluation  Patient Details  Name: Blake Rhodes MRN: [id number] Date of Birth: Mar 20, 1966 Referring Provider (PT): Ardith Dark, MD   Encounter Date: 05/03/2019  PT End of Session - 05/03/19 2146    Visit Number  1    Number of Visits  3    Date for PT Re-Evaluation  06/14/19    Authorization Type  [facility] UMR    PT Start Time  1214    PT Stop Time  1230    PT Time Calculation (min)  16 min    Activity Tolerance  Patient tolerated treatment well    Behavior During Therapy  [name] for tasks assessed/performed       Past Medical History:  Diagnosis Date  . Acute low back pain with sciatica   . Hyperlipidemia   . Sciatica bACK     Past Surgical History:  Procedure Laterality Date  . TONSILLECTOMY      There were no vitals filed for this visit.   Subjective Assessment - 05/03/19 2140    Subjective  I have been doing a lot of sitting at work. Started doing a little bit of running. I have not been stretching daily, just on the weekends. The last 3 days I have tried stretching more and it has really helped.    Currently in Pain?  No/denies         [facility] PT Assessment - 05/03/19 0001      Assessment   Medical Diagnosis  LBP with sciatica-unspecified side    Referring Provider (PT)  Ardith Dark, MD    Onset Date/Surgical Date  --   a couple of weeks ago   Hand Dominance  Right      Precautions   Precautions  None      Restrictions   Weight Bearing Restrictions  No      Balance Screen   Has the patient fallen in the past 6 months  No      Prior Function   Level of Independence  Independent    Vocation  Full time employment      Cognition   Overall Cognitive Status  Within Functional Limits for tasks assessed      Sensation   Additional Comments  WFL      Palpation   Palpation comment  limited  mobility in Rt femoral-acetabular joint                Objective measurements completed on examination: See above findings.      [facility] Adult PT Treatment/Exercise - 05/03/19 0001      Exercises   Exercises  Other Exercises    Other Exercises   gross stretching for LE biomechanical chain      Manual Therapy   Manual Therapy  Joint mobilization;Manual Traction    Joint Mobilization  Lt SIJ PA, fem-acet AP gr 4    Manual Traction  LAD Rt LE gr 4 & 5 with cavitation             PT Education - 05/03/19 2145    Education Details  anatomy of condition, POC, HEP, exercise form/rationale    Person(s) Educated  Patient    Methods  Explanation;Demonstration;Tactile cues;Verbal cues    Comprehension  Verbalized understanding;Returned demonstration;Verbal cues required;Tactile cues required;Need further instruction          PT  Long Term Goals - 05/03/19 2154      PT LONG TERM GOAL #1   Title  pt will be able to perform passive hip flexion for stretching of lower back without discomfort    Baseline  limited and uncomfortable at eval    Time  6    Period  Weeks    Status  New    Target Date  06/14/19      PT LONG TERM GOAL #2   Title  pt will find proper daily stretching regimen that works for him    Baseline  began discussing at eval    Time  6    Period  Weeks    Status  New    Target Date  06/14/19      PT LONG TERM GOAL #3   Title  Will be able to sit without pain into buttock    Baseline  became severe recently at eval    Time  6    Period  Weeks    Status  New    Target Date  06/14/19      PT LONG TERM GOAL #4   Title  will be able to sleep without limitation by Rt low back/hip    Baseline  limited at eval    Time  6    Period  Weeks    Status  New    Target Date  06/14/19             Plan - 05/03/19 2147    Clinical Impression Statement  Pt presents with limited joint mobility in Rt SIJ and femoral-acetabular joint. Was having  discomfort with hip flexion that was resolved following mobilization. He sits a lot at work and has not been making time for stretching. We discussed the importance of daily stretches, especially considering history with heel spurs. Pt will benefit from skilled PT to follow up on HEP to ensure that he is able to properly maintain gains and reduce future injury.    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    Examination-Activity Limitations  Locomotion Level;Bed Mobility;Bend;Sit;Sleep;Squat;Stairs;Stand    Examination-Participation Restrictions  Community Activity    Stability/Clinical Decision Making  Stable/Uncomplicated    Clinical Decision Making  Low    Rehab Potential  Good    PT Frequency  Biweekly    PT Duration  6 weeks    PT Treatment/Interventions  ADLs/Self Care Home Management;Cryotherapy;Electrical Stimulation;Ultrasound;Traction;Moist Heat;Iontophoresis 4mg /ml Dexamethasone;Functional mobility training;Therapeutic activities;Therapeutic exercise;Patient/family education;Neuromuscular re-education;Manual techniques;Passive range of motion;Dry needling;Spinal Manipulations;Taping;Joint Manipulations    PT Next Visit Plan  check hip joint mobility, daily stretching?    PT Home Exercise Plan  stretch daily, avoid sitting long periods    Consulted and Agree with Plan of Care  Patient       Patient will benefit from skilled therapeutic intervention in order to improve the following deficits and impairments:  Difficulty walking, Increased muscle spasms, Decreased activity tolerance, Pain, Improper body mechanics, Impaired flexibility, Hypomobility, Postural dysfunction, Decreased mobility  Visit Diagnosis: Acute right-sided low back pain with sciatica, sciatica laterality unspecified - Plan: PT plan of care cert/re-cert  Pain in right hip - Plan: PT plan of care cert/re-cert     Problem List Patient  Active Problem List   Diagnosis Date Noted  . Dyslipidemia 03/26/2019  . Low back pain 03/25/2019  Ramzey Petrovic C. Yaeko Fazekas PT, [name] 05/03/19 10:00 PM  [facility] [address] [place], [name], [id number] Phone: [phone]   Fax:  [phone]  Name: Blake GhaziBakul Nola MRN: [id number] Date of Birth: 01-Jan-1966

## 2019-05-14 ENCOUNTER — Ambulatory Visit: Payer: 59 | Admitting: Physical Therapy

## 2019-05-24 ENCOUNTER — Ambulatory Visit: Payer: 59 | Admitting: Physical Therapy

## 2019-06-20 ENCOUNTER — Encounter: Payer: Self-pay | Admitting: Family Medicine

## 2019-06-20 ENCOUNTER — Other Ambulatory Visit: Payer: Self-pay

## 2019-06-20 ENCOUNTER — Telehealth: Payer: 59 | Admitting: Physician Assistant

## 2019-06-20 DIAGNOSIS — R300 Dysuria: Secondary | ICD-10-CM

## 2019-06-20 NOTE — Progress Notes (Signed)
Based on what you shared with me, I feel your condition warrants further evaluation and I recommend that you be seen for a face to face visit.      Your symptoms are consistent with a urinary tract infection but you will need a urinalysis and culture prior to starting antibiotic therapy.    Please contact your primary care physician practice to be seen. Many offices offer virtual options to be seen via video if you are not comfortable going in person to a medical facility at this time.  If you do not have a PCP, [facility] offers a free physician referral service available at [phone]. Our trained staff has the experience, knowledge and resources to put you in touch with a physician who is right for you.   You also have the option of a video visit through [email or web address]  If you are having a true medical emergency please call 911.  NOTE: If you entered your credit card information for this eVisit, you will not be charged. You may see a "hold" on your card for the $35 but that hold will drop off and you will not have a charge processed.  Your e-visit answers were reviewed by a board certified advanced clinical practitioner to complete your personal care plan.  Thank you for using e-Visits.  Greater than 5 minutes, yet less than 10 minutes of time have been spent researching, coordinating, and implementing care for this patient today

## 2019-06-21 ENCOUNTER — Ambulatory Visit (INDEPENDENT_AMBULATORY_CARE_PROVIDER_SITE_OTHER): Payer: 59 | Admitting: Family Medicine

## 2019-06-21 DIAGNOSIS — R300 Dysuria: Secondary | ICD-10-CM

## 2019-06-21 LAB — POCT URINALYSIS DIPSTICK
Bilirubin, UA: NEGATIVE
Blood, UA: NEGATIVE
Glucose, UA: NEGATIVE
Ketones, UA: NEGATIVE
Leukocytes, UA: NEGATIVE
Nitrite, UA: NEGATIVE
Protein, UA: NEGATIVE
Spec Grav, UA: 1.01 (ref 1.010–1.025)
Urobilinogen, UA: 0.2 E.U./dL
pH, UA: 6.5 (ref 5.0–8.0)

## 2019-06-21 MED ORDER — CEPHALEXIN 500 MG PO CAPS: 500.0000 mg | ORAL_CAPSULE | Freq: Two times a day (BID) | ORAL | 0 refills | Status: AC

## 2019-06-21 NOTE — Progress Notes (Signed)
    Chief Complaint:  Blake Rhodes is a 53 y.o. male who presents today for a virtual office visit with a chief complaint of dysuria.   Assessment/Plan:  Dysuria UA negative but history consistent with past UTIs.  No signs of systemic illness.  Will start empiric Keflex until culture results return.  Encouraged good oral hydration.  Discussed reasons to return to care.     Subjective:  HPI:  Dysuria Started about a week ago. Symptoms are stable. Associated with itching. Worse in the evening. Had a UTI 5 years ago and symptoms are similar. No fevers or chills. No hematuria. No other obvious aggravating or alleviating factors.   ROS: Per HPI  PMH: He reports that he has never smoked. He has never used smokeless tobacco. He reports current alcohol use of about 2.0 standard drinks of alcohol per week. He reports that he does not use drugs.      Objective/Observations  Physical Exam: Gen: NAD, resting comfortably Pulm: Normal work of breathing Neuro: Grossly normal, moves all extremities Psych: Normal affect and thought content  Results for orders placed or performed in visit on 06/21/19 (from the past 24 hour(s))  POCT urinalysis dipstick     Status: None   Collection Time: 06/21/19  8:02 AM  Result Value Ref Range   Color, UA Yellow    Clarity, UA Clear    Glucose, UA Negative Negative   Bilirubin, UA Negative    Ketones, UA Negative    Spec Grav, UA 1.010 1.010 - 1.025   Blood, UA Negative    pH, UA 6.5 5.0 - 8.0   Protein, UA Negative Negative   Urobilinogen, UA 0.2 0.2 or 1.0 E.U./dL   Nitrite, UA Negative    Leukocytes, UA Negative Negative   Appearance     Odor       Virtual Visit via Video   I connected with Lonell Face on 06/21/19 at 11:00 AM EST by a video enabled telemedicine application and verified that I am speaking with the correct person using two identifiers. I discussed the limitations of evaluation and management by telemedicine and the availability of  in person appointments. The patient expressed understanding and agreed to proceed.   Patient location: Home Provider location: [place] participating in the virtual visit: Myself and Patient     Algis Greenhouse. Jerline Pain, MD 06/21/2019 11:15 AM

## 2019-06-21 NOTE — Telephone Encounter (Signed)
Link sent

## 2019-06-22 LAB — URINE CULTURE
MICRO NUMBER:: 1124514
Result:: NO GROWTH
SPECIMEN QUALITY:: ADEQUATE

## 2019-06-24 NOTE — Progress Notes (Signed)
Please inform patient of the following:  Urine culture is negative. Would like for him to finish his course of antibiotics and let us know if still having symptoms.  Algis Greenhouse. Jerline Pain, MD 06/24/2019 9:11 AM

## 2019-07-01 ENCOUNTER — Ambulatory Visit: Payer: 59 | Admitting: Family Medicine

## 2019-11-07 ENCOUNTER — Telehealth: Payer: Self-pay

## 2019-11-07 ENCOUNTER — Ambulatory Visit: Payer: 59 | Admitting: Dermatology

## 2019-11-07 ENCOUNTER — Other Ambulatory Visit: Payer: Self-pay

## 2019-11-07 DIAGNOSIS — L814 Other melanin hyperpigmentation: Secondary | ICD-10-CM

## 2019-11-07 DIAGNOSIS — L700 Acne vulgaris: Secondary | ICD-10-CM | POA: Diagnosis not present

## 2019-11-07 MED ORDER — TRETINOIN 0.025 % EX CREA: TOPICAL_CREAM | Freq: Every day | CUTANEOUS | 2 refills | Status: DC

## 2019-11-07 NOTE — Telephone Encounter (Signed)
Prior authorization done through Cover My Meds for Tretinoin Cream.  [employer] is reviewing your PA request. You may close this dialog, return to your dashboard, and perform other tasks.  To check for an update later, open this request again from your dashboard. If [employer] has not replied within 24 hours for urgent requests or within 48 hours for standard requests, please contact [employer] at [phone].

## 2019-11-11 ENCOUNTER — Encounter: Payer: Self-pay | Admitting: Dermatology

## 2019-11-11 NOTE — Telephone Encounter (Signed)
[employer] approval for Trtinoin 0.025% reference number [id number] from 11/09/19- 11/07/2020

## 2019-11-11 NOTE — Progress Notes (Signed)
   New Patient   Subjective  Blake Rhodes is a 54 y.o. male who presents for the following: Acne (Patient get pimples on the left side of face a few weeks ago. Patient tried benzol peroxide and otc acne treatments. Then it didnt go away so he put tea tree oil on bumps and that made patients skin burn and peel. That has now gotten better.  Patient also gets dark spots after the acne heals up and he wants to know what to do for the dark spots. ).  Acne + discoloration Location: Face Duration: Months Quality: Improved Associated Signs/Symptoms: Bumps and discoloration Modifying Factors: Benzyl peroxide and tea tree oil Severity:  Timing: Context: Worsened with Covid mask   The following portions of the chart were reviewed this encounter and updated as appropriate:     Objective  Well appearing patient in no apparent distress; mood and affect are within normal limits.  A focused examination was performed including head and neck. Relevant physical exam findings are noted in the Assessment and Plan.   Assessment & Plan  Acne vulgaris (2) Left Lower Cutaneous Lip; Left Buccal Cheek   Agree with use of benzoyl peroxide 2-3x/week while COVID mask necessary. Add Rx tretinoin to reduce risk PIH.  Ordered Medications: tretinoin (RETIN-A) 0.025 % cream  Lentigines (2) Left Zygomatic Area; Right Malar Cheek

## 2020-01-26 DIAGNOSIS — H6123 Impacted cerumen, bilateral: Secondary | ICD-10-CM | POA: Diagnosis not present

## 2020-03-12 ENCOUNTER — Telehealth: Payer: Self-pay

## 2020-03-12 NOTE — Telephone Encounter (Signed)
Pt is having lower abdominal pain and requesting if he can come between 12-2 tomorrow because he has a meeting [facility].

## 2020-03-12 NOTE — Telephone Encounter (Signed)
Patient scheduled.

## 2020-03-13 ENCOUNTER — Other Ambulatory Visit: Payer: Self-pay

## 2020-03-13 ENCOUNTER — Ambulatory Visit: Payer: 59 | Admitting: Family Medicine

## 2020-03-13 VITALS — BP 129/87 | HR 64 | Temp 98.2°F | Ht 70.0 in | Wt 181.2 lb

## 2020-03-13 DIAGNOSIS — R1030 Lower abdominal pain, unspecified: Secondary | ICD-10-CM | POA: Diagnosis not present

## 2020-03-13 MED ORDER — DICLOFENAC SODIUM 75 MG PO TBEC: 75.0000 mg | DELAYED_RELEASE_TABLET | Freq: Two times a day (BID) | ORAL | 0 refills | Status: DC

## 2020-03-13 NOTE — Progress Notes (Signed)
   Blake Rhodes is a 54 y.o. male who presents today for an office visit.  Assessment/Plan:  New/Acute Problems: Lower abdominal pain No red flags.  Benign abdominal exam.  Given positional nature and ability to reproduce on exam today likely represents groin strain or sports hernia.  Discussed home exercises and handout was given.  Will start diclofenac 75 mg twice daily for the next couple of weeks.  No signs of inguinal hernia today and has no pain with Valsalva.  Discussed reasons to return to care.  Left anterior rib pain Likely musculoskeletal.  Reproducible on exam.  Should improve with above.  If not may need to get x-ray.    Subjective:  HPI:  Patient here with abdominal pain for the past 2 to 3 months.  Located in the lower abdomen.  Pain only occurs when doing floor exercises or other sorts of exercises.  More predominant on the right lower abdomen also somewhat on left lower abdomen.  No specific treatments tried.  He has had intermittent constipation but is having multiple soft bowel movements daily now.  No diarrhea.  No nausea or vomiting.  No fevers or chills.  It also has some mild left anterior rib pain.       Objective:  Physical Exam: BP 129/87   Pulse 64   Temp 98.2 F (36.8 C)   Ht 5\' 10"  (1.778 m)   Wt 181 lb 3.2 oz (82.2 kg)   SpO2 98%   BMI 26.00 kg/m   Gen: No acute distress, resting comfortably GI: Soft, nontender, nondistended.  No inguinal hernias appreciated. MSK: -Lower extremity no deformities.  Strength 5 out of 5 throughout.  Pain elicited with extension of ipsilateral side. Neuro: Grossly normal, moves all extremities Psych: Normal affect and thought content      Preslea Rhodus M. , MD 03/13/2020 12:27 PM

## 2020-03-13 NOTE — Patient Instructions (Signed)
It was very nice to see you today!  Please work on the exercises.  Please take the diclofenac 1 pill twice daily for the next 2 weeks.  Let me know if your symptoms are not improving over the next couple weeks and we will discuss neck steps.  Take care, Dr Jimmey Ralph  Please try these tips to maintain a healthy lifestyle:   Eat at least 3 REAL meals and 1-2 snacks per day.  Aim for no more than 5 hours between eating.  If you eat breakfast, please do so within one hour of getting up.    Each meal should contain half fruits/vegetables, one quarter protein, and one quarter carbs (no bigger than a computer mouse)   Cut down on sweet beverages. This includes juice, soda, and sweet tea.     Drink at least 1 glass of water with each meal and aim for at least 8 glasses per day   Exercise at least 150 minutes every week.

## 2020-03-27 ENCOUNTER — Encounter: Payer: Self-pay | Admitting: Family Medicine

## 2020-03-30 ENCOUNTER — Encounter: Payer: 59 | Admitting: Family Medicine

## 2020-05-05 ENCOUNTER — Encounter: Payer: Self-pay | Admitting: Family Medicine

## 2020-06-03 ENCOUNTER — Encounter: Payer: Self-pay | Admitting: Family Medicine

## 2020-06-04 NOTE — Telephone Encounter (Signed)
Pt will come to our office to sign medical records release.

## 2020-06-13 ENCOUNTER — Ambulatory Visit: Payer: 59 | Attending: Internal Medicine

## 2020-06-13 DIAGNOSIS — Z23 Encounter for immunization: Secondary | ICD-10-CM

## 2020-06-13 NOTE — Progress Notes (Signed)
   Covid-19 Vaccination Clinic  Name:  Blake Rhodes    MRN: [id number] DOB: 08-23-1965  06/13/2020  [name] was observed post Covid-19 immunization for 15 minutes without incident. He was provided with Vaccine Information Sheet and instruction to access the V-Safe system.   [name] was instructed to call 911 with any severe reactions post vaccine: Marland Kitchen Difficulty breathing  . Swelling of face and throat  . A fast heartbeat  . A bad rash all over body  . Dizziness and weakness   Immunizations Administered    Name Date Dose VIS Date Route   Pfizer COVID-19 Vaccine 06/13/2020  3:01 PM 0.3 mL 05/20/2020 Intramuscular   Manufacturer: [employer], [employer]   Lot: [id number]   NDC: [address]-0

## 2020-07-11 DIAGNOSIS — Z1159 Encounter for screening for other viral diseases: Secondary | ICD-10-CM | POA: Diagnosis not present

## 2020-09-15 ENCOUNTER — Ambulatory Visit: Payer: 59 | Attending: Family Medicine

## 2020-09-15 ENCOUNTER — Encounter: Payer: Self-pay | Admitting: Family Medicine

## 2020-09-15 ENCOUNTER — Other Ambulatory Visit: Payer: Self-pay

## 2020-09-15 ENCOUNTER — Other Ambulatory Visit: Payer: Self-pay | Admitting: *Deleted

## 2020-09-15 DIAGNOSIS — M7750 Other enthesopathy of unspecified foot: Secondary | ICD-10-CM

## 2020-09-15 DIAGNOSIS — M25571 Pain in right ankle and joints of right foot: Secondary | ICD-10-CM | POA: Diagnosis not present

## 2020-09-15 DIAGNOSIS — R262 Difficulty in walking, not elsewhere classified: Secondary | ICD-10-CM | POA: Insufficient documentation

## 2020-09-15 NOTE — Telephone Encounter (Signed)
Pt calling in following up on this

## 2020-09-15 NOTE — Telephone Encounter (Signed)
Referral placed.

## 2020-09-15 NOTE — Therapy (Deleted)
[facility] [address] [place], [place], [address] Phone: [phone]   Fax:  [phone]  Physical Therapy Evaluation  Patient Details  Name: Blake Rhodes MRN: [id number] Date of Birth: 12-Dec-1965 Referring Provider (PT): Ardith Dark,   Encounter Date: 09/15/2020   PT End of Session - 09/15/20 1733    Visit Number 1    Number of Visits 13    Date for PT Re-Evaluation 10/31/20    Authorization Type [facility] UMR    PT Start Time 1550    PT Stop Time 1632    PT Time Calculation (min) 42 min    Activity Tolerance Patient tolerated treatment well    Behavior During Therapy [facility] for tasks assessed/performed           Past Medical History:  Diagnosis Date  . Acute low back pain with sciatica   . Hyperlipidemia   . Sciatica bACK     Past Surgical History:  Procedure Laterality Date  . TONSILLECTOMY      There were no vitals filed for this visit.    Subjective Assessment - 09/15/20 1551    Subjective Patient reports acute on chronic Rt heel pain that began again over the weekend maybe from walking too much. He denies pain with sitting, though has pain with walking and standing described as sharp along lateral aspect of heel rated as 3/10. He has completed PT before for this same issue and states he then feels good for about 6-9 months before the pain begins again.   Limitations Walking;Standing    How long can you sit comfortably? no issue    How long can you stand comfortably? unable    How long can you walk comfortably? unable    Patient Stated Goals to walk without pain, return to gym activity    Currently in Pain? No/denies              [facility] PT Assessment - 09/15/20 0001      Assessment   Medical Diagnosis Bone spur of foot    Referring Provider (PT) Ardith Dark,    Onset Date/Surgical Date --   years   Hand Dominance Right    Prior Therapy yes      Precautions   Precautions --    Precaution  Comments haglunds deformity      Restrictions   Weight Bearing Restrictions No      Balance Screen   Has the patient fallen in the past 6 months No      Prior Function   Level of Independence Independent    Vocation Full time employment      Cognition   Overall Cognitive Status Within Functional Limits for tasks assessed      Sensation   Light Touch Appears Intact      ROM / Strength   AROM / PROM / Strength --      AROM   Right Ankle Dorsiflexion 4    Left Ankle Dorsiflexion 9      PROM   Overall PROM Comments great toe extension: 35 Rt; 50 Lt      Palpation   Palpation comment TTP lateral aspect of calcaneous, tautness and Trigger points present lateral gastroc and soleus      Ambulation/Gait   Gait Comments foot flat contact RLE, decreased stance time RLE, limited push-off RLE  Objective measurements completed on examination: See above findings.       [facility] Adult PT Treatment/Exercise - 09/15/20 0001      Self-Care   Self-Care Other Self-Care Comments    Other Self-Care Comments  see patient education      Manual Therapy   Soft tissue mobilization Rt gastroc,soleus    Passive ROM stretching of gastroc and soleus and great toe flexors Rt            Trigger Point Dry Needling - 09/15/20 0001    Consent Given? Yes    Dry Needling Comments performed by Anne Ng, PT, [name], ATC    Gastrocnemius Response Twitch response elicited;Palpable increased muscle length                PT Education - 09/15/20 1731    Education Details Anatomy of condition, role of PT in treatment/rationale for exercises, HEP, modalities for pain control, use of crutches.Education on TPDN indications, expectations, side effects. Consent obtained for TPDN    Person(s) Educated Patient    Methods Explanation;Demonstration;Tactile cues;Verbal cues;Handout    Comprehension Verbalized understanding;Returned demonstration;Verbal cues  required;Tactile cues required;Need further instruction            PT Short Term Goals - 09/15/20 1733      PT SHORT TERM GOAL #1   Title STG=LTG             PT Long Term Goals - 09/15/20 1734      PT LONG TERM GOAL #1   Title Patient will be independent and compliant with long term HEP for management of chronic condition.    Baseline initial HEP given at eval.    Time 6    Period Weeks    Status New    Target Date 10/27/20      PT LONG TERM GOAL #2   Title Patient will demonstrate normalized and pain free gait.    Baseline antalgic; 3/10 pain    Time 6    Period Weeks    Status New    Target Date 10/27/20      PT LONG TERM GOAL #3   Title Patient will demonstrate at least 10 degrees of bilateral active ankle dorsiflexion to improve gait mechanics.    Baseline see flowsheet    Time 6    Period Weeks    Status New    Target Date 10/27/20      PT LONG TERM GOAL #4   Title Patient will demonstrate at least 50 degrees of passive Rt great toe extension to improve mechanics with push-off during terminal stance/pre-swing on the RLE.    Baseline see flowsheet    Time 6    Period Weeks    Status New    Target Date 10/27/20      PT LONG TERM GOAL #5   Title Patient will return to workout routine without Rt heel pain.    Baseline unable to complete gym activity currently secondary to pain.    Time 6    Period Weeks    Status New    Target Date 10/27/20                  Plan - 09/15/20 1744    Clinical Impression Statement Patient presents to OPPT with acute on chronic pain at distal insertion of achilles tendon on the RLE with weight-bearing activity that began over the weekend. He has previously attended PT for the same complaint with last episode  of care in 2020. He has limited foot/ankle ROM on the RLE, tautness and trigger points along gastroc/soleus, and pain with weight-bearing making his gait antalgic. It was recommended that he use his crutches at  this time until his pain reduces and he is able to demo normal gait mechanics. TPDN performed to gastroc with excellent twitch response elicited with patient reporting soreness post-intervention. Patient demonstrates improvements in gait mechanics at end of session with ability to heel strike on the Rt, though continues to have decreased stance time on the Rt. He will benefit from skilled PT to address above stated deficits as well as continuing with long term HEP in order to manage this chronic condition.    Personal Factors and Comorbidities Past/Current Experience;Time since onset of injury/illness/exacerbation    Examination-Activity Limitations Locomotion Level;Stand;Stairs;Squat;Lift;Carry;Transfers    Examination-Participation Restrictions Community Activity;Cleaning;Occupation    Stability/Clinical Decision Making Stable/Uncomplicated    Clinical Decision Making Low    Rehab Potential Good    PT Frequency --   1-2/week   PT Duration 6 weeks    PT Treatment/Interventions ADLs/Self Care Home Management;Cryotherapy;Electrical Stimulation;Ultrasound;Moist Heat;Iontophoresis 4mg /ml Dexamethasone;Functional mobility training;Therapeutic activities;Therapeutic exercise;Patient/family education;Neuromuscular re-education;Manual techniques;Passive range of motion;Dry needling;Taping;Gait training;Vasopneumatic Device    PT Next Visit Plan manual/TPDN gastroc/soleus, intrinsic strengthening    PT Home Exercise Plan Access Code    Consulted and Agree with Plan of Care Patient           Patient will benefit from skilled therapeutic intervention in order to improve the following deficits and impairments:  Abnormal gait,Difficulty walking,Pain,Impaired flexibility  Visit Diagnosis: Pain in right ankle and joints of right foot  Difficulty in walking, not elsewhere classified     Problem List Patient Active Problem List   Diagnosis Date Noted  . Dyslipidemia 03/26/2019  . Low back  pain 03/25/2019   03/27/2019, PT, [name], ATC 09/15/20 7:18 PM [facility] [address] [place], [place], [place] Phone: [phone]   Fax:  [phone]  Name: Blake Rhodes MRN: Eilene Ghazi Date of Birth: 02/17/1966

## 2020-09-15 NOTE — Therapy (Signed)
[facility] [address] [place], [place], [address] Phone: [phone]   Fax:  [phone]  Physical Therapy Evaluation  Patient Details  Name: Blake Rhodes MRN: [id number] Date of Birth: 05-16-1966 Referring Provider (PT): Ardith Dark,   Encounter Date: 09/15/2020   PT End of Session - 09/15/20 1733    Visit Number 1    Number of Visits 13    Date for PT Re-Evaluation 10/31/20    Authorization Type [facility] U[facility]    PT Start Time 1550    PT Stop Time 1632    PT Time Calculation (min) 42 min    Activity Tolerance Patient tolerated treatment well    Behavior During Therapy [facility] for tasks assessed/performed           Past Medical History:  Diagnosis Date  . Acute low back pain with sciatica   . Hyperlipidemia   . Sciatica bACK     Past Surgical History:  Procedure Laterality Date  . TONSILLECTOMY      There were no vitals filed for this visit.    Subjective Assessment - 09/15/20 1551    Subjective Patient reports acute on chronic Rt heel pain that began again over the weekend maybe from walking too much. He denies pain with sitting, though has pain with walking and standing described as sharp along lateral aspect of heel rated as 3/10. He has completed PT before for this same issue and states he then feels good for about 6-9 months before the pain begins.    Limitations Walking;Standing    How long can you sit comfortably? no issue    How long can you stand comfortably? unable    How long can you walk comfortably? unable    Patient Stated Goals to walk without pain, return to gym activity    Currently in Pain? No/denies              [facility] PT Assessment - 09/15/20 0001      Assessment   Medical Diagnosis Bone spur of foot    Referring Provider (PT) Ardith Dark,    Onset Date/Surgical Date --   years   Hand Dominance Right    Prior Therapy yes      Precautions   Precautions --    Precaution Comments  haglunds deformity      Restrictions   Weight Bearing Restrictions No      Balance Screen   Has the patient fallen in the past 6 months No      Prior Function   Level of Independence Independent    Vocation Full time employment      Cognition   Overall Cognitive Status Within Functional Limits for tasks assessed      Sensation   Light Touch Appears Intact      ROM / Strength   AROM / PROM / Strength --      AROM   Right Ankle Dorsiflexion 4    Left Ankle Dorsiflexion 9      PROM   Overall PROM Comments great toe extension: 35 Rt; 50 Lt      Palpation   Palpation comment TTP lateral aspect of calcaneous, tautness and Trigger points present lateral gastroc and soleus      Ambulation/Gait   Gait Comments foot flat contact RLE, decreased stance time RLE, limited push-off RLE  Objective measurements completed on examination: See above findings.       [facility] Adult PT Treatment/Exercise - 09/15/20 0001      Self-Care   Self-Care Other Self-Care Comments    Other Self-Care Comments  see patient education      Manual Therapy   Soft tissue mobilization Rt gastroc,soleus    Passive ROM stretching of gastroc and soleus and great toe flexors Rt            Trigger Point Dry Needling - 09/15/20 0001    Consent Given? Yes    Dry Needling Comments performed by Anne Ng, PT, [name], ATC    Gastrocnemius Response Twitch response elicited;Palpable increased muscle length                PT Education - 09/15/20 1731    Education Details Anatomy of condition, role of PT in treatment/rationale for exercises, HEP, modalities for pain control, use of crutches.Education on TPDN indications, expectations, side effects. Consent obtained for TPDN    Person(s) Educated Patient    Methods Explanation;Demonstration;Tactile cues;Verbal cues;Handout    Comprehension Verbalized understanding;Returned demonstration;Verbal cues required;Tactile cues  required;Need further instruction            PT Short Term Goals - 09/15/20 1733      PT SHORT TERM GOAL #1   Title STG=LTG             PT Long Term Goals - 09/15/20 1734      PT LONG TERM GOAL #1   Title Patient will be independent and compliant with long term HEP for management of chronic condition.    Baseline initial HEP given at eval.    Time 6    Period Weeks    Status New    Target Date 10/27/20      PT LONG TERM GOAL #2   Title Patient will demonstrate normalized and pain free gait.    Baseline antalgic; 3/10 pain    Time 6    Period Weeks    Status New    Target Date 10/27/20      PT LONG TERM GOAL #3   Title Patient will demonstrate at least 10 degrees of bilateral active ankle dorsiflexion to improve gait mechanics.    Baseline see flowsheet    Time 6    Period Weeks    Status New    Target Date 10/27/20      PT LONG TERM GOAL #4   Title Patient will demonstrate at least 50 degrees of passive Rt great toe extension to improve mechanics with push-off during terminal stance/pre-swing on the RLE.    Baseline see flowsheet    Time 6    Period Weeks    Status New    Target Date 10/27/20      PT LONG TERM GOAL #5   Title Patient will return to workout routine without Rt heel pain.    Baseline unable to complete gym activity currently secondary to pain.    Time 6    Period Weeks    Status New    Target Date 10/27/20                  Plan - 09/15/20 1744    Clinical Impression Statement Patient presents to OPPT with acute on chronic Rt calcaneal pain at achilles tendon insertion with weight-bearing activity that began over the weekend. He has previously attended PT for the same complaint with last episode of care in  2020. He has limited foot/ankle ROM on the RLE, tautness and trigger points along gastroc/soleus, and pain with weight-bearing making his gait antalgic. It was recommended that he use his crutches at this time until his pain reduces  and he is able to demo normal gait mechanics. TPDN performed to gastroc with excellent twitch response elicited with patient reporting soreness post-intervention. Patient demonstrates improvements in gait mechanics at end of session with ability to heel strike on the Rt, though continues to have decreased stance time on the Rt. He will benefit from skilled PT to address above stated deficits as well as continuing with long term HEP in order to manage this chronic condition.    Personal Factors and Comorbidities Past/Current Experience;Time since onset of injury/illness/exacerbation    Examination-Activity Limitations Locomotion Level;Stand;Stairs;Squat;Lift;Carry;Transfers    Examination-Participation Restrictions Community Activity;Cleaning;Occupation    Stability/Clinical Decision Making Stable/Uncomplicated    Clinical Decision Making Low    Rehab Potential Good    PT Frequency --   1-2/week   PT Duration 6 weeks    PT Treatment/Interventions ADLs/Self Care Home Management;Cryotherapy;Electrical Stimulation;Ultrasound;Moist Heat;Iontophoresis 4mg /ml Dexamethasone;Functional mobility training;Therapeutic activities;Therapeutic exercise;Patient/family education;Neuromuscular re-education;Manual techniques;Passive range of motion;Dry needling;Taping;Gait training;Vasopneumatic Device    PT Next Visit Plan manual/TPDN gastroc/soleus, intrinsic strengthening    PT Home Exercise Plan Access Code    Consulted and Agree with Plan of Care Patient           Patient will benefit from skilled therapeutic intervention in order to improve the following deficits and impairments:  Abnormal gait,Difficulty walking,Pain,Impaired flexibility  Visit Diagnosis: Pain in right ankle and joints of right foot  Difficulty in walking, not elsewhere classified     Problem List Patient Active Problem List   Diagnosis Date Noted  . Dyslipidemia 03/26/2019  . Low back pain 03/25/2019  03/27/2019,  PT, [name], ATC 09/15/20 7:34 PM  [facility] [address] [place], [place], [place] Phone: [phone]   Fax:  [phone]  Name: Blake Rhodes MRN: Eilene Ghazi Date of Birth: 12-Jan-1966

## 2020-09-16 ENCOUNTER — Ambulatory Visit: Payer: 59

## 2020-09-16 DIAGNOSIS — M25571 Pain in right ankle and joints of right foot: Secondary | ICD-10-CM | POA: Diagnosis not present

## 2020-09-16 DIAGNOSIS — R262 Difficulty in walking, not elsewhere classified: Secondary | ICD-10-CM

## 2020-09-16 NOTE — Therapy (Signed)
[facility] [address] [place], [place], [address] Phone: [phone]   Fax:  [phone]  Physical Therapy Treatment  Patient Details  Name: Blake Rhodes MRN: [id number] Date of Birth: 11/06/1965 Referring Provider (PT): Ardith Dark,   Encounter Date: 09/16/2020   PT End of Session - 09/16/20 1909    Visit Number 2    Number of Visits 13    Date for PT Re-Evaluation 10/31/20    Authorization Type [facility]    PT Start Time 1804    PT Stop Time 1850    PT Time Calculation (min) 46 min    Activity Tolerance Patient tolerated treatment well    Behavior During Therapy [facility] for tasks assessed/performed           Past Medical History:  Diagnosis Date  . Acute low back pain with sciatica   . Hyperlipidemia   . Sciatica bACK     Past Surgical History:  Procedure Laterality Date  . TONSILLECTOMY      There were no vitals filed for this visit.   Subjective Assessment - 09/16/20 1805    Subjective Patient reports the foot felt pretty good last night, but then woke up in pain again this morning. He reports taking advil, icing, and completing his HEP all which helped to reduce his pain some since this morning.    Currently in Pain? Yes    Pain Score 3     Pain Location Heel    Pain Orientation Right;Lateral    Pain Descriptors / Indicators Sharp    Pain Type Acute pain    Pain Onset In the past 7 days    Pain Frequency Intermittent                             [facility] Adult PT Treatment/Exercise - 09/16/20 0001      Self-Care   Other Self-Care Comments  see patient education      Iontophoresis   Type of Iontophoresis Dexamethasone    Location Rt posterolateral ankle    Dose 4 mg per ml    Time 4 hour patch      Manual Therapy   Soft tissue mobilization Rt gastroc,soleus, posterior tib    Passive ROM stretching of gastroc and soleus and great toe flexors Rt      Ankle Exercises: Stretches    Soleus Stretch 60 seconds    Soleus Stretch Limitations strap    Gastroc Stretch 60 seconds    Gastroc Stretch Limitations strap    Other Stretch prone quad stretch 60 sec      Ankle Exercises: Seated   Other Seated Ankle Exercises rockerboard A/P 2 x 20                  PT Education - 09/16/20 1907    Education Details Updated HEP. Recommendation to try heel cup for pain relief. Education on iontophoresis precautions, side effects, wear time. Modalities for pain control    Person(s) Educated Patient    Methods Explanation;Demonstration;Handout    Comprehension Verbalized understanding;Returned demonstration            PT Short Term Goals - 09/15/20 1733      PT SHORT TERM GOAL #1   Title STG=LTG             PT Long Term Goals - 09/15/20 1734      PT LONG TERM GOAL #  1   Title Patient will be independent and compliant with long term HEP for management of chronic condition.    Baseline initial HEP given at eval.    Time 6    Period Weeks    Status New    Target Date 10/27/20      PT LONG TERM GOAL #2   Title Patient will demonstrate normalized and pain free gait.    Baseline antalgic; 3/10 pain    Time 6    Period Weeks    Status New    Target Date 10/27/20      PT LONG TERM GOAL #3   Title Patient will demonstrate at least 10 degrees of bilateral active ankle dorsiflexion to improve gait mechanics.    Baseline see flowsheet    Time 6    Period Weeks    Status New    Target Date 10/27/20      PT LONG TERM GOAL #4   Title Patient will demonstrate at least 50 degrees of passive Rt great toe extension to improve mechanics with push-off during terminal stance/pre-swing on the RLE.    Baseline see flowsheet    Time 6    Period Weeks    Status New    Target Date 10/27/20      PT LONG TERM GOAL #5   Title Patient will return to workout routine without Rt heel pain.    Baseline unable to complete gym activity currently secondary to pain.    Time 6     Period Weeks    Status New    Target Date 10/27/20                 Plan - 09/16/20 1831    Clinical Impression Statement Patient tolerated session well today reporting a reduction in pain at end of session. Tautness and palpable trigger points remain in gastroc/soleus with partial release from manual therapy.  Patient will potentially benefit from further TPDN at future sessions if trigger points remain. Patient noted to have limited quad flexibility bilaterally, so quad stretch was added to HEP. Discussed continuing with HEP, ice, and potential use of heel cup as main goal currently is pain reduction and slowly progress towards strengthening/gait as tolerated with patient verbalizing understanding.    Personal Factors and Comorbidities Past/Current Experience;Time since onset of injury/illness/exacerbation    Examination-Activity Limitations Locomotion Level;Stand;Stairs;Squat;Lift;Carry;Transfers    Examination-Participation Restrictions Community Activity;Cleaning;Occupation    Stability/Clinical Decision Making Stable/Uncomplicated    Rehab Potential Good    PT Frequency --   1-2/week   PT Duration 6 weeks    PT Treatment/Interventions ADLs/Self Care Home Management;Cryotherapy;Electrical Stimulation;Ultrasound;Moist Heat;Iontophoresis 4mg /ml Dexamethasone;Functional mobility training;Therapeutic activities;Therapeutic exercise;Patient/family education;Neuromuscular re-education;Manual techniques;Passive range of motion;Dry needling;Taping;Gait training;Vasopneumatic Device    PT Next Visit Plan response to heel cup if patient purchased. manual/TPDN gastroc/soleus, intrinsic strengthening    PT Home Exercise Plan Access Code    Consulted and Agree with Plan of Care Patient           Patient will benefit from skilled therapeutic intervention in order to improve the following deficits and impairments:  Abnormal gait,Difficulty walking,Pain,Impaired flexibility  Visit  Diagnosis: Pain in right ankle and joints of right foot  Difficulty in walking, not elsewhere classified     Problem List Patient Active Problem List   Diagnosis Date Noted  . Dyslipidemia 03/26/2019  . Low back pain 03/25/2019   03/27/2019, PT, [name], ATC 09/16/20 7:22 PM  [facility] [address] [place], [place], [address] Phone: [phone]   Fax:  [phone]  Name: Blake Rhodes MRN: [id number] Date of Birth: [birth year]

## 2020-09-16 NOTE — Patient Instructions (Signed)

## 2020-09-22 NOTE — Progress Notes (Signed)
[facility] [address] [place] [address] Phone: [phone] Subjective:   I Blake Rhodes am serving as a Neurosurgeon for Dr. Antoine Primas.  This visit occurred during the SARS-CoV-2 public health emergency.  Safety protocols were in place, including screening questions prior to the visit, additional usage of staff PPE, and extensive cleaning of exam room while observing appropriate contact time as indicated for disinfecting solutions.   I'm seeing this patient by the request  of:  Ardith Dark, MD  CC:  Right  Heel pain   [id number]  Blake Rhodes is a 55 y.o. male coming in with complaint of right heel pain. Patient states he has bilateral burn spurs. States the pain has been chronic 2-3 years on and off. States when he has a flare he can barely walk. Has tried multiple modalities. Happens every 6-9 months. Last happened when he was on vacation in [place]. Received an injection that helped him walk. Injection was given 12/26 or 27. Remembers a lot of driving and wearing his hiking shoes. Has stopped activity since then. Right is worse than left. Patient also limps when he walk. States the pain is always in the right. Wants to avoid surgery. Has tried heel lifts. States the pain then radiates to his midfoot with heel lifts. Pain usually states in the posterior heel. Distal achilles. Walking barefoot is slightly easier than wearing shoes. Overall he has issues barefoot and in shoes. Going to PT and has an appointment tomorrow. Received a new set of exercises. Inversion is painful. Currently not taking pain meds but when the pain started he took Advil. Taking multiple vitamins including vitamin D.        Past Medical History:  Diagnosis Date  . Acute low back pain with sciatica   . Hyperlipidemia   . Sciatica bACK    Past Surgical History:  Procedure Laterality Date  . TONSILLECTOMY     Social History   Socioeconomic History  . Marital status:  Married    Spouse name: Not on file  . Number of children: Not on file  . Years of education: Not on file  . Highest education level: Not on file  Occupational History  . Not on file  Tobacco Use  . Smoking status: Never Smoker  . Smokeless tobacco: Never Used  Vaping Use  . Vaping Use: Never used  Substance and Sexual Activity  . Alcohol use: Yes    Alcohol/week: 2.0 standard drinks    Types: 2 Cans of beer per week    Comment: socially  . Drug use: No  . Sexual activity: Yes  Other Topics Concern  . Not on file  Social History Narrative  . Not on file   Social Determinants of Health   Financial Resource Strain: Not on file  Food Insecurity: Not on file  Transportation Needs: Not on file  Physical Activity: Not on file  Stress: Not on file  Social Connections: Not on file   No Known Allergies Family History  Problem Relation Age of Onset  . Diabetes Mother   . Dementia Father   . High blood pressure Father   . Hypertension Father   . Mental illness Father   . Diabetes Maternal Grandfather   . Hypertension Paternal Grandmother   . Heart disease Paternal Grandfather        Current Outpatient Medications (Analgesics):  .  diclofenac (VOLTAREN) 75 MG EC tablet, Take 1 tablet (75 mg total) by  mouth 2 (two) times daily.   Current Outpatient Medications (Other):  .  tretinoin (RETIN-A) 0.025 % cream, Apply topically at bedtime. Apply to acne every other night   Reviewed prior external information including notes and imaging from  primary care provider As well as notes that were available from care everywhere and other healthcare systems.  Past medical history, social, surgical and family history all reviewed in electronic medical record.  No pertanent information unless stated regarding to the chief complaint.   Review of Systems:  No headache, visual changes, nausea, vomiting, diarrhea, constipation, dizziness, abdominal pain, skin rash, fevers, chills,  night sweats, weight loss, swollen lymph nodes, body aches, joint swelling, chest pain, shortness of breath, mood changes. POSITIVE muscle aches  Objective  Blood pressure (!) 150/90, pulse 75, height 5\' 10"  (1.778 m), weight 179 lb (81.2 kg), SpO2 98 %.   General: No apparent distress alert and oriented x3 mood and affect normal, dressed appropriately.  HEENT: Pupils equal, extraocular movements intact  Respiratory: Patient's speak in full sentences and does not appear short of breath  Cardiovascular: No lower extremity edema, non tender, no erythema  MSK: Patient's right heel shows that patient has good range of motion.  Minimal tenderness just to the right side on the calcaneal area near the insertion of the Achilles.  No inflammation noted.  No pain in the retrocalcaneal area.  Neurovascularly intact distally.  Limited musculoskeletal ultrasound was performed and interpreted by  Limited ultrasound of patient's Achilles shows no significant hypoechoic changes or any tearing appreciated.  Patient does have a small bone spur noted off the calcaneal but seems to be superficial to the insertion of the Achilles.  Patient fell on the lateral aspect of the heel where patient's pain is the most painful seem to have more of a varicose vein.  Fully compressible though noted. Impression: Nonspecific findings of the Achilles    Impression and Recommendations:     The above documentation has been reviewed and is accurate and complete [name], DO

## 2020-09-23 ENCOUNTER — Ambulatory Visit: Payer: 59 | Admitting: Physical Therapy

## 2020-09-23 ENCOUNTER — Other Ambulatory Visit: Payer: Self-pay

## 2020-09-23 DIAGNOSIS — R262 Difficulty in walking, not elsewhere classified: Secondary | ICD-10-CM

## 2020-09-23 DIAGNOSIS — M25571 Pain in right ankle and joints of right foot: Secondary | ICD-10-CM

## 2020-09-23 NOTE — Therapy (Signed)
[place], [place], [address] Phone: [phone]   Fax:  [phone]  Physical Therapy Treatment  Patient Details  Name: Blake Rhodes MRN: [id number] Date of Birth: June 04, 1966 Referring Provider (PT): Vivi Barrack,   Encounter Date: 09/23/2020   PT End of Session - 09/23/20 0823    Visit Number 3    Number of Visits 13    Date for PT Re-Evaluation 10/31/20    Authorization Type [facility]    PT Start Time 0823   inlate   PT Stop Time 0846    PT Time Calculation (min) 23 min    Activity Tolerance Patient tolerated treatment well    Behavior During Therapy [facility] for tasks assessed/performed           Past Medical History:  Diagnosis Date  . Acute low back pain with sciatica   . Hyperlipidemia   . Sciatica bACK     Past Surgical History:  Procedure Laterality Date  . TONSILLECTOMY      There were no vitals filed for this visit.   Subjective Assessment - 09/23/20 0823    Subjective reports he is doing some of his HEP feels like the problem is    Patient Stated Goals to walk without pain, return to gym activity    Currently in Pain? Yes    Pain Score 4     Pain Location Ankle    Pain Orientation Right   achilles tendon   Pain Descriptors / Indicators Sharp    Pain Type Acute pain                             [facility] Adult PT Treatment/Exercise - 09/23/20 0001      Self-Care   Other Self-Care Comments  reviewed foam rolling, focus on lower calf - soleous and then moving up to gastroc, also usig Lt LE on top for extra pressure.      Modalities   Modalities Iontophoresis;Ultrasound      Ultrasound   Ultrasound Location Rt posterior lateral ankle/heel    Ultrasound Parameters 50%, 1.0w/cm2, .3.mHz    Ultrasound Goals Pain;Edema      Iontophoresis   Type of Iontophoresis Dexamethasone    Location Rt posterolateral ankle    Dose 4 mg per ml    Time 4 hour patch       Manual Therapy   Manual therapy comments not performed today - shortened tx d/t late arrival      Ankle Exercises: Stretches   Other Stretch Rt posterior talus mobs using green strap around ankle and in lowered half kneel                    PT Short Term Goals - 09/15/20 1733      PT SHORT TERM GOAL #1   Title STG=LTG             PT Long Term Goals - 09/15/20 1734      PT LONG TERM GOAL #1   Title Patient will be independent and compliant with long term HEP for management of chronic condition.    Baseline initial HEP given at eval.    Time 6    Period Weeks    Status New    Target Date 10/27/20      PT LONG TERM GOAL #2   Title Patient will demonstrate normalized and pain free gait.  Baseline antalgic; 3/10 pain    Time 6    Period Weeks    Status New    Target Date 10/27/20      PT LONG TERM GOAL #3   Title Patient will demonstrate at least 10 degrees of bilateral active ankle dorsiflexion to improve gait mechanics.    Baseline see flowsheet    Time 6    Period Weeks    Status New    Target Date 10/27/20      PT LONG TERM GOAL #4   Title Patient will demonstrate at least 50 degrees of passive Rt great toe extension to improve mechanics with push-off during terminal stance/pre-swing on the RLE.    Baseline see flowsheet    Time 6    Period Weeks    Status New    Target Date 10/27/20      PT LONG TERM GOAL #5   Title Patient will return to workout routine without Rt heel pain.    Baseline unable to complete gym activity currently secondary to pain.    Time 6    Period Weeks    Status New    Target Date 10/27/20                 Plan - 09/23/20 9470    Clinical Impression Statement Todays treatment was shortened due to late arrival.  He reports performing his HEP, has modified one exercise to isometric foot crunch as he feels this working his muscles more.  He is tolerating ionto well,  Reports in the past he responded well to ultrasound,  this was added today.  HEP was progressed to work on improving ankle DF to reduce tendon rub on bone spur.  Discussion was held on the importance of continuing with his exercise even after PT is over as he has reoccurrence of similiar injury.  Only the third visit, no goals met.  Heel cup did not help him, caused more problems.  He would benefit from continued PT to address his defecits and possibly more DN.    Rehab Potential Good    PT Duration 6 weeks    PT Treatment/Interventions ADLs/Self Care Home Management;Cryotherapy;Electrical Stimulation;Ultrasound;Moist Heat;Iontophoresis 67m/ml Dexamethasone;Functional mobility training;Therapeutic activities;Therapeutic exercise;Patient/family education;Neuromuscular re-education;Manual techniques;Passive range of motion;Dry needling;Taping;Gait training;Vasopneumatic Device    PT Next Visit Plan Rt ankle ROM, modalities PRN and manual work to g[employer] peroneals and anterior tib.    PT Home Exercise Plan Access Code [email or web address]   Consulted and Agree with Plan of Care Patient           Patient will benefit from skilled therapeutic intervention in order to improve the following deficits and impairments:  Abnormal gait,Difficulty walking,Pain,Impaired flexibility  Visit Diagnosis: Pain in right ankle and joints of right foot  Difficulty in walking, not elsewhere classified     Problem List Patient Active Problem List   Diagnosis Date Noted  . Dyslipidemia 03/26/2019  . Low back pain 03/25/2019    SJeral PinchPT  09/23/2020, 9:41 AM  [facility] [address]Phone: [phone]  Fax:  [phone] Name: BJeramyah GoodpastureMRN: [id number] of Birth: [birth year]

## 2020-09-24 ENCOUNTER — Ambulatory Visit (INDEPENDENT_AMBULATORY_CARE_PROVIDER_SITE_OTHER): Payer: 59

## 2020-09-24 ENCOUNTER — Ambulatory Visit: Payer: Self-pay

## 2020-09-24 ENCOUNTER — Other Ambulatory Visit: Payer: Self-pay

## 2020-09-24 ENCOUNTER — Ambulatory Visit: Payer: 59 | Admitting: Family Medicine

## 2020-09-24 ENCOUNTER — Encounter: Payer: Self-pay | Admitting: Family Medicine

## 2020-09-24 VITALS — BP 150/90 | HR 75 | Ht 70.0 in | Wt 179.0 lb

## 2020-09-24 DIAGNOSIS — M25571 Pain in right ankle and joints of right foot: Secondary | ICD-10-CM

## 2020-09-24 DIAGNOSIS — M79672 Pain in left foot: Secondary | ICD-10-CM | POA: Diagnosis not present

## 2020-09-24 DIAGNOSIS — M79671 Pain in right foot: Secondary | ICD-10-CM

## 2020-09-24 DIAGNOSIS — M7660 Achilles tendinitis, unspecified leg: Secondary | ICD-10-CM | POA: Diagnosis not present

## 2020-09-24 DIAGNOSIS — G8929 Other chronic pain: Secondary | ICD-10-CM

## 2020-09-24 NOTE — Assessment & Plan Note (Signed)
Patient has pain over the posterior aspect of the heel.  This seems just to the right side of the Achilles.  Patient on exam does have a heel spur noted on previous x-rays but it does seem to be superficial when noticing on the ultrasound.  Likely not contributing as much to the Achilles pain.  Patient does have mild varicose veins noted on the posterior aspect where patient does have the pain that could be contributing.  Discussed compression, home exercises, heel lift, icing regimen.  Patient can continue with physical therapy if he thinks it is beneficial.  If this continues to give his trouble secondary to the longevity of this we can consider possible MRI.  Patient is in agreement with the plan.

## 2020-09-24 NOTE — Patient Instructions (Addendum)
Good to see you Achilles tendonitis Compression socks daily Heel lifts Keep working with PT Use pennsaid on the foot Xray right ankle Hoka or oofos recover sandals  See me again in 6 weeks we will talk MRI in the long run

## 2020-09-25 ENCOUNTER — Ambulatory Visit (HOSPITAL_BASED_OUTPATIENT_CLINIC_OR_DEPARTMENT_OTHER): Payer: 59 | Attending: Family Medicine | Admitting: Physical Therapy

## 2020-09-25 DIAGNOSIS — M25571 Pain in right ankle and joints of right foot: Secondary | ICD-10-CM | POA: Diagnosis not present

## 2020-09-25 DIAGNOSIS — R262 Difficulty in walking, not elsewhere classified: Secondary | ICD-10-CM | POA: Diagnosis not present

## 2020-09-25 NOTE — Therapy (Signed)
[facility] [address] [place], [place], [address] Phone: [phone]   Fax:  [phone]  Physical Therapy Treatment  Patient Details  Name: Blake Rhodes MRN: [id number] Date of Birth: 07/07/1966 Referring Provider (PT): Ardith Dark,   Encounter Date: 09/25/2020   PT End of Session - 09/25/20 1350    Visit Number 4    Number of Visits 13    Date for PT Re-Evaluation 10/31/20    Authorization Type [facility]    PT Start Time 1300    PT Stop Time 1330    PT Time Calculation (min) 30 min    Activity Tolerance Patient tolerated treatment well    Behavior During Therapy [facility] for tasks assessed/performed           Past Medical History:  Diagnosis Date  . Acute low back pain with sciatica   . Hyperlipidemia   . Sciatica bACK     Past Surgical History:  Procedure Laterality Date  . TONSILLECTOMY      There were no vitals filed for this visit.   Subjective Assessment - 09/25/20 1300    Subjective Pt states he was given a lot of stretching exercises and been doing them as needed. Pt got a new heel lift from Dr. Katrinka Blazing that he has not tried yet. Pt got a new x-ray yesterday that did not show anything new (still has mild heel spur on R)    Limitations Walking;Standing    How long can you sit comfortably? no issue    How long can you stand comfortably? unable    How long can you walk comfortably? unable    Patient Stated Goals to walk without pain, return to gym activity            Manual therapy on R ankle:  Talocrural distraction 3x10 sec  Talocrural PA mobs to improve DF 3x10 sec  Contract/relax DF 5 sec hold x 3  Therex:  Double leg eccentric gastroc on step x10  Single leg eccentric gastroc on step x10  Double leg eccentric soleus on step x10 (cues to decrease knee valgus)  Attempted single leg eccentric soleus on step x10 but unable to maintain knee bend  Seated soleus concentric with 10lb kettlebell  x10  Standing:  Gastroc stretch x30 sec  Soleus stretch x30 sec             PT Education - 09/25/20 1344    Education Details Discussed adjusting his HEP to include joint mobilization and soleus strengthening.    Person(s) Educated Patient    Methods Explanation;Demonstration;Handout    Comprehension Verbalized understanding;Returned demonstration            PT Short Term Goals - 09/15/20 1733      PT SHORT TERM GOAL #1   Title STG=LTG             PT Long Term Goals - 09/15/20 1734      PT LONG TERM GOAL #1   Title Patient will be independent and compliant with long term HEP for management of chronic condition.    Baseline initial HEP given at eval.    Time 6    Period Weeks    Status New    Target Date 10/27/20      PT LONG TERM GOAL #2   Title Patient will demonstrate normalized and pain free gait.    Baseline antalgic; 3/10 pain    Time 6    Period  Weeks    Status New    Target Date 10/27/20      PT LONG TERM GOAL #3   Title Patient will demonstrate at least 10 degrees of bilateral active ankle dorsiflexion to improve gait mechanics.    Baseline see flowsheet    Time 6    Period Weeks    Status New    Target Date 10/27/20      PT LONG TERM GOAL #4   Title Patient will demonstrate at least 50 degrees of passive Rt great toe extension to improve mechanics with push-off during terminal stance/pre-swing on the RLE.    Baseline see flowsheet    Time 6    Period Weeks    Status New    Target Date 10/27/20      PT LONG TERM GOAL #5   Title Patient will return to workout routine without Rt heel pain.    Baseline unable to complete gym activity currently secondary to pain.    Time 6    Period Weeks    Status New    Target Date 10/27/20                 Plan - 09/25/20 1345    Clinical Impression Statement Pt only able to stay for 30 minutes. Session focused on providing joint mobilization due to decreased dorsiflexion, improving calf  stretching to include soleus and adjusting strengthening exercises to include both eccentric gastroc and eccentric soleus contraction. Pt able to ambulate without pain after performing talocrural distraction and PA mobs.    Personal Factors and Comorbidities Past/Current Experience;Time since onset of injury/illness/exacerbation    Examination-Activity Limitations Locomotion Level;Stand;Stairs;Squat;Lift;Carry;Transfers    Examination-Participation Restrictions Community Activity;Cleaning;Occupation    Stability/Clinical Decision Making Stable/Uncomplicated    Rehab Potential Good    PT Duration 6 weeks    PT Treatment/Interventions ADLs/Self Care Home Management;Cryotherapy;Electrical Stimulation;Ultrasound;Moist Heat;Iontophoresis 4mg /ml Dexamethasone;Functional mobility training;Therapeutic activities;Therapeutic exercise;Patient/family education;Neuromuscular re-education;Manual techniques;Passive range of motion;Dry needling;Taping;Gait training;Vasopneumatic Device    PT Next Visit Plan Continue joint mobilization as needed. Stretch gastroc and soleus. Continue eccentric strengthening of gastroc & soleus in single leg stance. Check hip strength next session as this may be contributing to issues down the chain. Check single leg stance to determine if okay to load with plyometrics    PT Home Exercise Plan Access Code    Consulted and Agree with Plan of Care Patient           Patient will benefit from skilled therapeutic intervention in order to improve the following deficits and impairments:  Abnormal gait,Difficulty walking,Pain,Impaired flexibility  Visit Diagnosis: Pain in right ankle and joints of right foot  Difficulty in walking, not elsewhere classified     Problem List Patient Active Problem List   Diagnosis Date Noted  . Achilles tendon pain 09/24/2020  . Dyslipidemia 03/26/2019  . Low back pain 03/25/2019    [facility] [address] [place] PT, [name] 09/25/2020, 1:51  PM  [facility] [address] [place], [place], [place] Phone: [phone]   Fax:  [phone]  Name: Harrie Cazarez MRN: Eilene Ghazi Date of Birth: 1966-07-16

## 2020-09-28 ENCOUNTER — Other Ambulatory Visit: Payer: Self-pay

## 2020-09-28 ENCOUNTER — Ambulatory Visit (HOSPITAL_BASED_OUTPATIENT_CLINIC_OR_DEPARTMENT_OTHER): Payer: 59 | Attending: Family Medicine | Admitting: Physical Therapy

## 2020-09-28 DIAGNOSIS — M25551 Pain in right hip: Secondary | ICD-10-CM | POA: Diagnosis not present

## 2020-09-28 DIAGNOSIS — M25572 Pain in left ankle and joints of left foot: Secondary | ICD-10-CM | POA: Diagnosis not present

## 2020-09-28 DIAGNOSIS — R262 Difficulty in walking, not elsewhere classified: Secondary | ICD-10-CM | POA: Insufficient documentation

## 2020-09-28 DIAGNOSIS — M25571 Pain in right ankle and joints of right foot: Secondary | ICD-10-CM | POA: Insufficient documentation

## 2020-09-28 DIAGNOSIS — M5440 Lumbago with sciatica, unspecified side: Secondary | ICD-10-CM | POA: Diagnosis not present

## 2020-09-28 NOTE — Therapy (Signed)
[facility] [address] [place], [place], [address] Phone: [phone]   Fax:  [phone]  Physical Therapy Treatment  Patient Details  Name: Sebastien Jackson MRN: [id number] Date of Birth: 1966-01-13 Referring Provider (PT): Ardith Dark,   Encounter Date: 09/28/2020   PT End of Session - 09/28/20 1057    Visit Number 5    Number of Visits 13    Date for PT Re-Evaluation 10/31/20    Authorization Type [facility]    PT Start Time 0910    PT Stop Time 0930    PT Time Calculation (min) 20 min    Activity Tolerance Patient tolerated treatment well    Behavior During Therapy [facility] for tasks assessed/performed           Past Medical History:  Diagnosis Date  . Acute low back pain with sciatica   . Hyperlipidemia   . Sciatica bACK     Past Surgical History:  Procedure Laterality Date  . TONSILLECTOMY      There were no vitals filed for this visit.   Subjective Assessment - 09/28/20 0914    Subjective Pt states he did all of the stretches and tried the exercises this weekend. Pt reports wearing the new heel lift provided by [employer] [facility] in his tennis shoes this weekend and had good results with a decrease in his pain. Heel lift does not remain in the right place in his work shoes.  Pt states he felt good after last session.    Limitations Walking;Standing    How long can you sit comfortably? no issue    How long can you stand comfortably? unable    How long can you walk comfortably? unable    Patient Stated Goals to walk without pain, return to gym activity                             [facility] Adult PT Treatment/Exercise - 09/28/20 0001      Ankle Exercises: Standing   Other Standing Ankle Exercises Eccentric soleus PF on step x10    Other Standing Ankle Exercises Single leg eccentric gastroc on step x10; quick heel raise 3 x 30 sec                    PT Short Term Goals - 09/15/20 1733       PT SHORT TERM GOAL #1   Title STG=LTG             PT Long Term Goals - 09/15/20 1734      PT LONG TERM GOAL #1   Title Patient will be independent and compliant with long term HEP for management of chronic condition.    Baseline initial HEP given at eval.    Time 6    Period Weeks    Status New    Target Date 10/27/20      PT LONG TERM GOAL #2   Title Patient will demonstrate normalized and pain free gait.    Baseline antalgic; 3/10 pain    Time 6    Period Weeks    Status New    Target Date 10/27/20      PT LONG TERM GOAL #3   Title Patient will demonstrate at least 10 degrees of bilateral active ankle dorsiflexion to improve gait mechanics.    Baseline see flowsheet    Time 6    Period Weeks  Status New    Target Date 10/27/20      PT LONG TERM GOAL #4   Title Patient will demonstrate at least 50 degrees of passive Rt great toe extension to improve mechanics with push-off during terminal stance/pre-swing on the RLE.    Baseline see flowsheet    Time 6    Period Weeks    Status New    Target Date 10/27/20      PT LONG TERM GOAL #5   Title Patient will return to workout routine without Rt heel pain.    Baseline unable to complete gym activity currently secondary to pain.    Time 6    Period Weeks    Status New    Target Date 10/27/20                 Plan - 09/28/20 1055    Clinical Impression Statement Shortened session due to later arrival. Continued to provide joint mobilization to improve dorsiflexion. Added bouncing heel raises to work on plantarflexor endurance and increase muscle load. Pt able to demo good soleus contraction on step.    Personal Factors and Comorbidities Past/Current Experience;Time since onset of injury/illness/exacerbation    Examination-Activity Limitations Locomotion Level;Stand;Stairs;Squat;Lift;Carry;Transfers    Examination-Participation Restrictions Community Activity;Cleaning;Occupation    Stability/Clinical Decision  Making Stable/Uncomplicated    Rehab Potential Good    PT Duration 6 weeks    PT Treatment/Interventions ADLs/Self Care Home Management;Cryotherapy;Electrical Stimulation;Ultrasound;Moist Heat;Iontophoresis 4mg /ml Dexamethasone;Functional mobility training;Therapeutic activities;Therapeutic exercise;Patient/family education;Neuromuscular re-education;Manual techniques;Passive range of motion;Dry needling;Taping;Gait training;Vasopneumatic Device    PT Next Visit Plan Continue joint mobilization as needed. Stretch gastroc and soleus. Continue eccentric strengthening of gastroc & soleus in single leg stance. Check hip strength next session as this may be contributing to issues down the chain. Check single leg stance to determine if okay to load with plyometrics    PT Home Exercise Plan Access Code    Consulted and Agree with Plan of Care Patient           Patient will benefit from skilled therapeutic intervention in order to improve the following deficits and impairments:  Abnormal gait,Difficulty walking,Pain,Impaired flexibility  Visit Diagnosis: Pain in right ankle and joints of right foot  Difficulty in walking, not elsewhere classified  Acute right-sided low back pain with sciatica, sciatica laterality unspecified  Pain in right hip  Pain in left ankle and joints of left foot     Problem List Patient Active Problem List   Diagnosis Date Noted  . Achilles tendon pain 09/24/2020  . Dyslipidemia 03/26/2019  . Low back pain 03/25/2019    [facility] April May 09/28/2020, 10:57 AM  [facility] [address] [place], [place], [place] Phone: [phone]   Fax:  [phone]  Name: Birdie Beveridge MRN: Eilene Ghazi Date of Birth: 03/08/1966

## 2020-10-02 ENCOUNTER — Other Ambulatory Visit: Payer: Self-pay

## 2020-10-02 ENCOUNTER — Ambulatory Visit (HOSPITAL_BASED_OUTPATIENT_CLINIC_OR_DEPARTMENT_OTHER): Payer: 59 | Attending: Family Medicine | Admitting: Physical Therapy

## 2020-10-02 DIAGNOSIS — R262 Difficulty in walking, not elsewhere classified: Secondary | ICD-10-CM | POA: Diagnosis not present

## 2020-10-02 DIAGNOSIS — M5440 Lumbago with sciatica, unspecified side: Secondary | ICD-10-CM | POA: Diagnosis not present

## 2020-10-02 DIAGNOSIS — M25571 Pain in right ankle and joints of right foot: Secondary | ICD-10-CM | POA: Diagnosis not present

## 2020-10-02 NOTE — Therapy (Addendum)
[facility] [address] [place], [place], [address] Phone: [phone]   Fax:  [phone]  Physical Therapy Treatment and Discharge  Patient Details  Name: Blake Rhodes MRN: [id number] Date of Birth: 1966-02-20 Referring Provider (PT): Vivi Barrack,   PHYSICAL THERAPY DISCHARGE SUMMARY  Visits from Start of Care: 6  Current functional level related to goals / functional outcomes: Less pain, able to use orthotics with good response in certain shoes   Remaining deficits: See below   Education / Equipment: See below  Plan: Patient agrees to discharge.  Patient goals were not met. Patient is being discharged due to not returning since last visit.        Encounter Date: 10/02/2020   PT End of Session - 10/02/20 1001     Visit Number 6    Number of Visits 13    Date for PT Re-Evaluation 10/31/20    Authorization Type [facility]    PT Start Time 0940    PT Stop Time 1015    PT Time Calculation (min) 35 min    Activity Tolerance Patient tolerated treatment well    Behavior During Therapy WFL for tasks assessed/performed             Past Medical History:  Diagnosis Date   Acute low back pain with sciatica    Hyperlipidemia    Sciatica bACK     Past Surgical History:  Procedure Laterality Date   TONSILLECTOMY      There were no vitals filed for this visit.   Subjective Assessment - 10/02/20 0939     Subjective Pt states he went walking and felt pretty good with his tennis shoes. Pt states he's been biking. Pt had no pain last night. Pt reports the inserts don't work well in his work shoes.    Limitations Walking;Standing    How long can you sit comfortably? no issue    How long can you stand comfortably? unable    How long can you walk comfortably? unable    Patient Stated Goals to walk without pain, return to gym activity                               [facility] Adult PT  Treatment/Exercise - 10/02/20 0001       Manual Therapy   Manual Therapy Joint mobilization    Joint Mobilization talocrural distraction 3x10 sec; PA talocrural mobs into DF      Ankle Exercises: Standing   Other Standing Ankle Exercises Forward Lunges 2x10; step downs 3x10 leading with L LE; side step 3x10 R LE leading; forward stepping 3x10 bilat    Other Standing Ankle Exercises Single leg heel rebounding 3x10; single leg eccentric gastroc & soleus 2x10      Ankle Exercises: Plyometrics   Plyometric Exercises On shuttle 50# bilateral hops 3x10; single leg hop 25# 3x10      Ankle Exercises: Aerobic   Other Aerobic Sci-fit 2x 5 min L2   Warm-up and cool-down                     PT Short Term Goals - 09/15/20 1733       PT SHORT TERM GOAL #1   Title STG=LTG               PT Long Term Goals - 09/15/20 1734       PT LONG  TERM GOAL #1   Title Patient will be independent and compliant with long term HEP for management of chronic condition.    Baseline initial HEP given at eval.    Time 6    Period Weeks    Status New    Target Date 10/27/20      PT LONG TERM GOAL #2   Title Patient will demonstrate normalized and pain free gait.    Baseline antalgic; 3/10 pain    Time 6    Period Weeks    Status New    Target Date 10/27/20      PT LONG TERM GOAL #3   Title Patient will demonstrate at least 10 degrees of bilateral active ankle dorsiflexion to improve gait mechanics.    Baseline see flowsheet    Time 6    Period Weeks    Status New    Target Date 10/27/20      PT LONG TERM GOAL #4   Title Patient will demonstrate at least 50 degrees of passive Rt great toe extension to improve mechanics with push-off during terminal stance/pre-swing on the RLE.    Baseline see flowsheet    Time 6    Period Weeks    Status New    Target Date 10/27/20      PT LONG TERM GOAL #5   Title Patient will return to workout routine without Rt heel pain.    Baseline  unable to complete gym activity currently secondary to pain.    Time 6    Period Weeks    Status New    Target Date 10/27/20                   Plan - 10/02/20 1013     Clinical Impression Statement Treatment session continues to focus on progressing pt's exercises to increase Achilles tendon loading in a variety of movements. Initiated light plyometrics on shuttle. Pt able to tolerate well. Discussed how to advance/progress him into the next phase of treatment with increased plyometric loading.    Personal Factors and Comorbidities Past/Current Experience;Time since onset of injury/illness/exacerbation    Examination-Activity Limitations Locomotion Level;Stand;Stairs;Squat;Lift;Carry;Transfers    Examination-Participation Restrictions Community Activity;Cleaning;Occupation    Stability/Clinical Decision Making Stable/Uncomplicated    Rehab Potential Good    PT Duration 6 weeks    PT Treatment/Interventions ADLs/Self Care Home Management;Cryotherapy;Electrical Stimulation;Ultrasound;Moist Heat;Iontophoresis 34m/ml Dexamethasone;Functional mobility training;Therapeutic activities;Therapeutic exercise;Patient/family education;Neuromuscular re-education;Manual techniques;Passive range of motion;Dry needling;Taping;Gait training;Vasopneumatic Device    PT Next Visit Plan Continue joint mobilization as needed. Stretch gastroc and soleus. Continue eccentric strengthening of gastroc & soleus in single leg stance. Continue to progress to plyometrics as able    PT Home Exercise Plan Access Code [email or web address]   Consulted and Agree with Plan of Care Patient             Patient will benefit from skilled therapeutic intervention in order to improve the following deficits and impairments:  Abnormal gait,Difficulty walking,Pain,Impaired flexibility  Visit Diagnosis: Pain in right ankle and joints of right foot  Difficulty in walking, not elsewhere classified  Acute right-sided low back pain  with sciatica, sciatica laterality unspecified     Problem List Patient Active Problem List   Diagnosis Date Noted   Achilles tendon pain 09/24/2020   Dyslipidemia 03/26/2019   Low back pain 03/25/2019    Dulcemaria Bula April Ma L Abagael Kramm PT, [name] 10/02/2020, 10:18 AM  [id number]  [address] Phone: [phone]   Fax:  [phone]  Name: Azaan Leask MRN: [id number] Date of Birth: [birth year]

## 2020-10-19 ENCOUNTER — Ambulatory Visit: Payer: 59 | Admitting: Family Medicine

## 2020-11-03 NOTE — Progress Notes (Signed)
[facility] [address] [place] [address] Phone: [phone] Subjective:   I Blake Rhodes am serving as a Neurosurgeon for Dr. Antoine Rhodes.  This visit occurred during the SARS-CoV-2 public health emergency.  Safety protocols were in place, including screening questions prior to the visit, additional usage of staff PPE, and extensive cleaning of exam room while observing appropriate contact time as indicated for disinfecting solutions.   I'm seeing this patient by the request  of:  Blake Dark, MD  CC: Ankle pain follow-up  [id number]   09/24/2020 Patient has pain over the posterior aspect of the heel.  This seems just to the right side of the Achilles.  Patient on exam does have a heel spur noted on previous x-rays but it does seem to be superficial when noticing on the ultrasound.  Likely not contributing as much to the Achilles pain.  Patient does have mild varicose veins noted on the posterior aspect where patient does have the pain that could be contributing.  Discussed compression, home exercises, heel lift, icing regimen.  Patient can continue with physical therapy if he thinks it is beneficial.  If this continues to give his trouble secondary to the longevity of this we can consider possible MRI.  Patient is in agreement with the plan.  Update 11/04/2020 Blake Rhodes is a 55 y.o. male coming in with complaint of achilles tendon pain, R. Patient states he feels the same. Got better which was short term. States he went to PT. This past weekend he went for light hiking and shopping when he noticed pain Sunday night. Used ice and completed stretches. Seated calf raise machine helps. Oofos recovery sandals help. Pennsaid helps at night and he wears a sling. When he wakes up he has pain as well.        Past Medical History:  Diagnosis Date  . Acute low back pain with sciatica   . Hyperlipidemia   . Sciatica bACK    Past Surgical History:   Procedure Laterality Date  . TONSILLECTOMY     Social History   Socioeconomic History  . Marital status: Married    Spouse name: Not on file  . Number of children: Not on file  . Years of education: Not on file  . Highest education level: Not on file  Occupational History  . Not on file  Tobacco Use  . Smoking status: Never Smoker  . Smokeless tobacco: Never Used  Vaping Use  . Vaping Use: Never used  Substance and Sexual Activity  . Alcohol use: Yes    Alcohol/week: 2.0 standard drinks    Types: 2 Cans of beer per week    Comment: socially  . Drug use: No  . Sexual activity: Yes  Other Topics Concern  . Not on file  Social History Narrative  . Not on file   Social Determinants of Health   Financial Resource Strain: Not on file  Food Insecurity: Not on file  Transportation Needs: Not on file  Physical Activity: Not on file  Stress: Not on file  Social Connections: Not on file   No Known Allergies Family History  Problem Relation Age of Onset  . Diabetes Mother   . Dementia Father   . High blood pressure Father   . Hypertension Father   . Mental illness Father   . Diabetes Maternal Grandfather   . Hypertension Paternal Grandmother   . Heart disease Paternal Grandfather  Current Outpatient Medications (Other):  Marland Kitchen  Diclofenac Sodium (PENNSAID) 2 % SOLN, Place 2 g onto the skin 2 (two) times daily.   Reviewed prior external information including notes and imaging from  primary care provider As well as notes that were available from care everywhere and other healthcare systems.  Past medical history, social, surgical and family history all reviewed in electronic medical record.  No pertanent information unless stated regarding to the chief complaint.   Review of Systems:  No headache, visual changes, nausea, vomiting, diarrhea, constipation, dizziness, abdominal pain, skin rash, fevers, chills, night sweats, weight loss, swollen lymph nodes,  body aches, joint swelling, chest pain, shortness of breath, mood changes. POSITIVE muscle aches  Objective  Blood pressure 130/90, pulse 76, height 5\' 10"  (1.778 m), weight 178 lb (80.7 kg), SpO2 98 %.   General: No apparent distress alert and oriented x3 mood and affect normal, dressed appropriately.  HEENT: Pupils equal, extraocular movements intact  Respiratory: Patient's speak in full sentences and does not appear short of breath  Cardiovascular: No lower extremity edema, non tender, no erythema  Gait normal with good balance and coordination.  MSK:  Right ankle exam shows the patient does have tenderness to palpation over the Achilles more on the medial aspect.  Patient has point tenderness over the bony aspect of the calcaneus just to the lateral aspect at the insertion of the Achilles  Limited musculoskeletal ultrasound was performed and interpreted by  Limited ultrasound shows the patient does have calcific been noted superficial to the Achilles tendon at the insertion.  This is where patient is more tender.  Mild increase in Doppler flow in the area.  Patient still has a very mild atypical varicose vein on the lateral aspect of the ankle that is mildly tender as well. Impression: Very superficial calcific change of the soft tissue that could be a superficial heel spur.    Impression and Recommendations:     The above documentation has been reviewed and is accurate and complete Blake Saa, DO

## 2020-11-04 ENCOUNTER — Encounter: Payer: Self-pay | Admitting: Family Medicine

## 2020-11-04 ENCOUNTER — Other Ambulatory Visit: Payer: Self-pay

## 2020-11-04 ENCOUNTER — Ambulatory Visit: Payer: 59 | Admitting: Family Medicine

## 2020-11-04 DIAGNOSIS — M7660 Achilles tendinitis, unspecified leg: Secondary | ICD-10-CM | POA: Diagnosis not present

## 2020-11-04 MED ORDER — PENNSAID 2 % EX SOLN: 2.0000 g | Freq: Two times a day (BID) | CUTANEOUS | 3 refills | Status: DC

## 2020-11-04 NOTE — Patient Instructions (Addendum)
Good to see you Sent in pennsaid I will call fellowship to see if we can do sound wave therapy Otherwise if not up and running we will consider MRI You will hear from [place] soon See me again in 6 weeks just incase

## 2020-11-04 NOTE — Assessment & Plan Note (Signed)
Patient does have more of a Achilles pain noted.  Discussed icing regimen and home exercises.  Patient continues to and fortunately have mild calcific findings noted.  There is seem to be a heel spur but seems to be significantly superficial and not part of the Achilles tendon.  We will see if patient could be a candidate for an acoustic calcific treatment options.  If patient is not due to the longevity of this pain I would consider the possibility of an MRI to further evaluate.  Patient will have a follow-up with me in 6 weeks and continue the same regimen at this time.  Prescription for topical anti-inflammatory given.

## 2020-11-09 ENCOUNTER — Other Ambulatory Visit: Payer: Self-pay

## 2020-11-09 ENCOUNTER — Ambulatory Visit: Payer: 59 | Admitting: Family Medicine

## 2020-11-09 ENCOUNTER — Encounter: Payer: Self-pay | Admitting: Family Medicine

## 2020-11-09 DIAGNOSIS — M7660 Achilles tendinitis, unspecified leg: Secondary | ICD-10-CM

## 2020-11-09 NOTE — Progress Notes (Signed)
Blake Rhodes - 55 y.o. male MRN [id number]  Date of birth: Dec 26, 1965  SUBJECTIVE:  Including CC & ROS.  No chief complaint on file.   Blake Rhodes is a 55 y.o. male that is presenting with acute on chronic right heel pain.  Pain is occurring at the insertion of the Achilles.  Has been bothering him for over a year.  Has tried different therapies with limited improvement.  Independent review of the right ankle x-ray from 2/25 shows anenthesophyte of the Achilles.   Review of Systems See HPI   HISTORY: Past Medical, Surgical, Social, and Family History Reviewed & Updated per EMR.   Pertinent Historical Findings include:  Past Medical History:  Diagnosis Date  . Acute low back pain with sciatica   . Hyperlipidemia   . Sciatica bACK     Past Surgical History:  Procedure Laterality Date  . TONSILLECTOMY      Family History  Problem Relation Age of Onset  . Diabetes Mother   . Dementia Father   . High blood pressure Father   . Hypertension Father   . Mental illness Father   . Diabetes Maternal Grandfather   . Hypertension Paternal Grandmother   . Heart disease Paternal Grandfather     Social History   Socioeconomic History  . Marital status: Married    Spouse name: Not on file  . Number of children: Not on file  . Years of education: Not on file  . Highest education level: Not on file  Occupational History  . Not on file  Tobacco Use  . Smoking status: Never Smoker  . Smokeless tobacco: Never Used  Vaping Use  . Vaping Use: Never used  Substance and Sexual Activity  . Alcohol use: Yes    Alcohol/week: 2.0 standard drinks    Types: 2 Cans of beer per week    Comment: socially  . Drug use: No  . Sexual activity: Yes  Other Topics Concern  . Not on file  Social History Narrative  . Not on file   Social Determinants of Health   Financial Resource Strain: Not on file  Food Insecurity: Not on file  Transportation Needs: Not on file  Physical Activity: Not  on file  Stress: Not on file  Social Connections: Not on file  Intimate Partner Violence: Not on file     PHYSICAL EXAM:  VS: BP 120/88 (BP Location: Left Arm, Patient Position: Sitting, Cuff Size: Large)   Ht 5\' 10"  (1.778 m)   Wt 175 lb (79.4 kg)   BMI 25.11 kg/m  Physical Exam Gen: NAD, alert, cooperative with exam, well-appearing MSK:  Right heel:  No swelling or ecchymosis. Normal range of motion. Tenderness to palpation at the insertion of the Achilles. Normal strength resistance. Neurovascular intact  Aspiration/Injection Procedure Note Izic Stfort [birth year]  Procedure: ECSWT Indications: right heel pain   Procedure Details Consent: Risks of procedure as well as the alternatives and risks of each were explained to the (patient/caregiver).  Consent for procedure obtained. Time Out: Verified patient identification, verified procedure, site/side was marked, verified correct patient position, special equipment/implants available, medications/allergies/relevent history reviewed, required imaging and test results available.  Performed.  The area was cleaned with iodine and alcohol swabs.    The right heel  was targeted for Extracorporeal shockwave therapy.   Preset: heel spur Power Level: 60  Frequency: 10 Impulse/cycles: 2200 Head size: large    Patient did tolerate procedure well.    ASSESSMENT & PLAN:  Achilles tendon pain Acute on chronic in nature.  Has an enthesophyte on x-ray and hyperemia on ultrasound. -Counseled on home exercise therapy and supportive care. -For shockwave therapy completed. -Follow-up in 1 week.

## 2020-11-09 NOTE — Assessment & Plan Note (Signed)
Acute on chronic in nature.  Has an enthesophyte on x-ray and hyperemia on ultrasound. -Counseled on home exercise therapy and supportive care. -For shockwave therapy completed. -Follow-up in 1 week.

## 2020-11-10 ENCOUNTER — Encounter: Payer: Self-pay | Admitting: Family Medicine

## 2020-11-11 ENCOUNTER — Other Ambulatory Visit: Payer: Self-pay

## 2020-11-11 ENCOUNTER — Ambulatory Visit: Payer: 59 | Admitting: Family Medicine

## 2020-11-11 ENCOUNTER — Encounter: Payer: Self-pay | Admitting: Family Medicine

## 2020-11-11 ENCOUNTER — Other Ambulatory Visit (HOSPITAL_BASED_OUTPATIENT_CLINIC_OR_DEPARTMENT_OTHER): Payer: Self-pay

## 2020-11-11 ENCOUNTER — Ambulatory Visit: Payer: Self-pay

## 2020-11-11 VITALS — BP 140/82 | Ht 70.0 in | Wt 175.0 lb

## 2020-11-11 DIAGNOSIS — M7732 Calcaneal spur, left foot: Secondary | ICD-10-CM | POA: Diagnosis not present

## 2020-11-11 DIAGNOSIS — G8929 Other chronic pain: Secondary | ICD-10-CM

## 2020-11-11 MED ORDER — PREDNISONE 5 MG PO TABS: ORAL_TABLET | ORAL | 0 refills | Status: DC

## 2020-11-11 MED ORDER — PREDNISONE 5 MG PO TABS
ORAL_TABLET | ORAL | 0 refills | Status: DC
  Filled 2020-11-11: qty 21, 6d supply, fill #0

## 2020-11-11 NOTE — Patient Instructions (Signed)
Good to see you  Please send me a message in MyChart with any questions or updates.  Please see me back in 1 week.   --Dr. Winfrey Chillemi  

## 2020-11-11 NOTE — Progress Notes (Signed)
Blake Rhodes - 55 y.o. male MRN [id number]  Date of birth: [birth year]  SUBJECTIVE:  Including CC & ROS.  No chief complaint on file.   Blake Rhodes is a 55 y.o. male that is presenting with acute left heel pain.  It feels similar to his right.  Pain is posterior in nature.  Denies any injury or inciting event.  Having trouble with ambulation.  No history of surgery.   Review of Systems See HPI   HISTORY: Past Medical, Surgical, Social, and Family History Reviewed & Updated per EMR.   Pertinent Historical Findings include:  Past Medical History:  Diagnosis Date  . Acute low back pain with sciatica   . Hyperlipidemia   . Sciatica bACK     Past Surgical History:  Procedure Laterality Date  . TONSILLECTOMY      Family History  Problem Relation Age of Onset  . Diabetes Mother   . Dementia Father   . High blood pressure Father   . Hypertension Father   . Mental illness Father   . Diabetes Maternal Grandfather   . Hypertension Paternal Grandmother   . Heart disease Paternal Grandfather     Social History   Socioeconomic History  . Marital status: Married    Spouse name: Not on file  . Number of children: Not on file  . Years of education: Not on file  . Highest education level: Not on file  Occupational History  . Not on file  Tobacco Use  . Smoking status: Never Smoker  . Smokeless tobacco: Never Used  Vaping Use  . Vaping Use: Never used  Substance and Sexual Activity  . Alcohol use: Yes    Alcohol/week: 2.0 standard drinks    Types: 2 Cans of beer per week    Comment: socially  . Drug use: No  . Sexual activity: Yes  Other Topics Concern  . Not on file  Social History Narrative  . Not on file   Social Determinants of Health   Financial Resource Strain: Not on file  Food Insecurity: Not on file  Transportation Needs: Not on file  Physical Activity: Not on file  Stress: Not on file  Social Connections: Not on file  Intimate Partner Violence: Not on  file     PHYSICAL EXAM:  VS: BP 140/82 (BP Location: Left Arm, Patient Position: Sitting, Cuff Size: Large)   Ht 5\' 10"  (1.778 m)   Wt 175 lb (79.4 kg)   BMI 25.11 kg/m  Physical Exam Gen: NAD, alert, cooperative with exam, well-appearing MSK:  Left foot: Tenderness to palpation at the insertion of the Achilles. Swelling at this occurrence. No warmth or redness. Normal range of motion. Pain with weightbearing. Neurovascular intact  Limited ultrasound: Left foot:  Large calcaneal heel spur appreciated.  No hyperemia in the area. Achilles appears and normal diameter. There is hypoechoic change within the fat pad to suggest posterior impingement.  Summary: Large heel spur with findings of posterior impingement.  Ultrasound and interpretation by , MD  Aspiration/Injection Procedure Note Blake Rhodes August 30, 1965  Procedure: ECSWT Indications: left heel pain   Procedure Details Consent: Risks of procedure as well as the alternatives and risks of each were explained to the (patient/caregiver).  Consent for procedure obtained. Time Out: Verified patient identification, verified procedure, site/side was marked, verified correct patient position, special equipment/implants available, medications/allergies/relevent history reviewed, required imaging and test results available.  Performed.  The area was cleaned with iodine and alcohol swabs.  The left heel was targeted for Extracorporeal shockwave therapy.   Preset: heel spur Power Level: 40 Frequency: 10 Impulse/cycles: 2000 Head size: large    Patient did tolerate procedure well.   ASSESSMENT & PLAN:   Heel spur, left Ms. Lawrence but no significant changes of the achilles.  May have underlying posterior impingement as well. -Counseled on home exercise therapy care. -Shockwave therapy. -Prednisone -Could consider physical therapy monitor patches.

## 2020-11-12 DIAGNOSIS — M7660 Achilles tendinitis, unspecified leg: Secondary | ICD-10-CM | POA: Insufficient documentation

## 2020-11-12 DIAGNOSIS — M7732 Calcaneal spur, left foot: Secondary | ICD-10-CM | POA: Insufficient documentation

## 2020-11-12 DIAGNOSIS — M6528 Calcific tendinitis, other site: Secondary | ICD-10-CM | POA: Insufficient documentation

## 2020-11-12 NOTE — Assessment & Plan Note (Signed)
Ms. Blake Rhodes but no significant changes of the achilles.  May have underlying posterior impingement as well. -Counseled on home exercise therapy care. -Shockwave therapy. -Prednisone -Could consider physical therapy monitor patches.

## 2020-11-13 ENCOUNTER — Encounter: Payer: Self-pay | Admitting: Family Medicine

## 2020-11-16 ENCOUNTER — Other Ambulatory Visit (HOSPITAL_BASED_OUTPATIENT_CLINIC_OR_DEPARTMENT_OTHER): Payer: Self-pay

## 2020-11-16 ENCOUNTER — Encounter: Payer: Self-pay | Admitting: Family Medicine

## 2020-11-16 ENCOUNTER — Ambulatory Visit: Payer: Self-pay | Admitting: Sports Medicine

## 2020-11-16 ENCOUNTER — Other Ambulatory Visit: Payer: Self-pay

## 2020-11-16 ENCOUNTER — Ambulatory Visit (INDEPENDENT_AMBULATORY_CARE_PROVIDER_SITE_OTHER): Payer: Self-pay | Admitting: Family Medicine

## 2020-11-16 DIAGNOSIS — M7660 Achilles tendinitis, unspecified leg: Secondary | ICD-10-CM

## 2020-11-16 DIAGNOSIS — M7732 Calcaneal spur, left foot: Secondary | ICD-10-CM

## 2020-11-16 MED ORDER — NITROGLYCERIN 0.2 MG/HR TD PT24
MEDICATED_PATCH | TRANSDERMAL | 11 refills | Status: DC
  Filled 2020-11-16: qty 21, 84d supply, fill #0

## 2020-11-16 NOTE — Assessment & Plan Note (Signed)
Completed 2nd shockwave.  - f/u in one week.

## 2020-11-16 NOTE — Progress Notes (Signed)
  Blake Rhodes - 55 y.o. male MRN [id number]  Date of birth: Jan 11, 1966  SUBJECTIVE:  Including CC & ROS.  No chief complaint on file.   Blake Rhodes is a 55 y.o. male that is here for the shockwave therapy.    Review of Systems See HPI   HISTORY: Past Medical, Surgical, Social, and Family History Reviewed & Updated per EMR.   Pertinent Historical Findings include:  Past Medical History:  Diagnosis Date  . Acute low back pain with sciatica   . Hyperlipidemia   . Sciatica bACK     Past Surgical History:  Procedure Laterality Date  . TONSILLECTOMY      Family History  Problem Relation Age of Onset  . Diabetes Mother   . Dementia Father   . High blood pressure Father   . Hypertension Father   . Mental illness Father   . Diabetes Maternal Grandfather   . Hypertension Paternal Grandmother   . Heart disease Paternal Grandfather     Social History   Socioeconomic History  . Marital status: Married    Spouse name: Not on file  . Number of children: Not on file  . Years of education: Not on file  . Highest education level: Not on file  Occupational History  . Not on file  Tobacco Use  . Smoking status: Never Smoker  . Smokeless tobacco: Never Used  Vaping Use  . Vaping Use: Never used  Substance and Sexual Activity  . Alcohol use: Yes    Alcohol/week: 2.0 standard drinks    Types: 2 Cans of beer per week    Comment: socially  . Drug use: No  . Sexual activity: Yes  Other Topics Concern  . Not on file  Social History Narrative  . Not on file   Social Determinants of Health   Financial Resource Strain: Not on file  Food Insecurity: Not on file  Transportation Needs: Not on file  Physical Activity: Not on file  Stress: Not on file  Social Connections: Not on file  Intimate Partner Violence: Not on file     PHYSICAL EXAM:  VS: Ht 5\' 10"  (1.778 m)   Wt 175 lb (79.4 kg)   BMI 25.11 kg/m  Physical Exam Gen: NAD, alert, cooperative with exam,  well-appearing  Aspiration/Injection Procedure Note Blake Rhodes 08-22-1965  Procedure: ECSWT Indications: right heel pain   Procedure Details Consent: Risks of procedure as well as the alternatives and risks of each were explained to the (patient/caregiver).  Consent for procedure obtained. Time Out: Verified patient identification, verified procedure, site/side was marked, verified correct patient position, special equipment/implants available, medications/allergies/relevent history reviewed, required imaging and test results available.  Performed.  The area was cleaned with iodine and alcohol swabs.    The right heel was targeted for Extracorporeal shockwave therapy.   Preset: heel spur Power Level: 70 Frequency: 10 Impulse/cycles: 2300 Head size: large    Patient did tolerate procedure well.     ASSESSMENT & PLAN:   Achilles tendon pain Completed 2nd shockwave.  - f/u in one week.   Heel spur, left Initiate nitro patches.

## 2020-11-16 NOTE — Assessment & Plan Note (Signed)
Initiate nitro patches.

## 2020-11-18 ENCOUNTER — Encounter: Payer: Self-pay | Admitting: Family Medicine

## 2020-11-18 ENCOUNTER — Ambulatory Visit: Payer: Self-pay | Admitting: Family Medicine

## 2020-11-18 ENCOUNTER — Other Ambulatory Visit: Payer: Self-pay

## 2020-11-18 DIAGNOSIS — M7732 Calcaneal spur, left foot: Secondary | ICD-10-CM

## 2020-11-18 NOTE — Addendum Note (Signed)
Addended by: Myra Rude on: 11/18/2020 01:57 PM   Modules accepted: Orders

## 2020-11-18 NOTE — Patient Instructions (Signed)

## 2020-11-18 NOTE — Assessment & Plan Note (Signed)
Having changes of the achilles more as opposed to the heel spur.  - second ECSWT  - f/u in one week.

## 2020-11-18 NOTE — Progress Notes (Signed)
  Blake Rhodes - 55 y.o. male MRN [id number]  Date of birth: 1965-12-12  SUBJECTIVE:  Including CC & ROS.  No chief complaint on file.   Blake Rhodes is a 55 y.o. male that is here for his second shockwave therapy.    Review of Systems See HPI   HISTORY: Past Medical, Surgical, Social, and Family History Reviewed & Updated per EMR.   Pertinent Historical Findings include:  Past Medical History:  Diagnosis Date  . Acute low back pain with sciatica   . Hyperlipidemia   . Sciatica bACK     Past Surgical History:  Procedure Laterality Date  . TONSILLECTOMY      Family History  Problem Relation Age of Onset  . Diabetes Mother   . Dementia Father   . High blood pressure Father   . Hypertension Father   . Mental illness Father   . Diabetes Maternal Grandfather   . Hypertension Paternal Grandmother   . Heart disease Paternal Grandfather     Social History   Socioeconomic History  . Marital status: Married    Spouse name: Not on file  . Number of children: Not on file  . Years of education: Not on file  . Highest education level: Not on file  Occupational History  . Not on file  Tobacco Use  . Smoking status: Never Smoker  . Smokeless tobacco: Never Used  Vaping Use  . Vaping Use: Never used  Substance and Sexual Activity  . Alcohol use: Yes    Alcohol/week: 2.0 standard drinks    Types: 2 Cans of beer per week    Comment: socially  . Drug use: No  . Sexual activity: Yes  Other Topics Concern  . Not on file  Social History Narrative  . Not on file   Social Determinants of Health   Financial Resource Strain: Not on file  Food Insecurity: Not on file  Transportation Needs: Not on file  Physical Activity: Not on file  Stress: Not on file  Social Connections: Not on file  Intimate Partner Violence: Not on file     PHYSICAL EXAM:  VS: Ht 5\' 10"  (1.778 m)   Wt 175 lb (79.4 kg)   BMI 25.11 kg/m  Physical Exam Gen: NAD, alert, cooperative with exam,  well-appearing  Aspiration/Injection Procedure Note Blake Rhodes 1965-08-18  Procedure: ECSWT Indications: left heel pain   Procedure Details Consent: Risks of procedure as well as the alternatives and risks of each were explained to the (patient/caregiver).  Consent for procedure obtained. Time Out: Verified patient identification, verified procedure, site/side was marked, verified correct patient position, special equipment/implants available, medications/allergies/relevent history reviewed, required imaging and test results available.  Performed.  The area was cleaned with iodine and alcohol swabs.    The left heel was targeted for Extracorporeal shockwave therapy.   Preset: heel spur Power Level: 40 Frequency: 10 Impulse/cycles: 2000 Head size: large    Patient did tolerate procedure well.     ASSESSMENT & PLAN:   Heel spur, left Having changes of the achilles more as opposed to the heel spur.  - second ECSWT  - f/u in one week.

## 2020-11-19 ENCOUNTER — Encounter (HOSPITAL_BASED_OUTPATIENT_CLINIC_OR_DEPARTMENT_OTHER): Payer: Self-pay | Admitting: Physical Therapy

## 2020-11-19 ENCOUNTER — Ambulatory Visit (HOSPITAL_BASED_OUTPATIENT_CLINIC_OR_DEPARTMENT_OTHER): Payer: 59 | Attending: Family Medicine | Admitting: Physical Therapy

## 2020-11-19 DIAGNOSIS — M25572 Pain in left ankle and joints of left foot: Secondary | ICD-10-CM | POA: Diagnosis not present

## 2020-11-19 DIAGNOSIS — R262 Difficulty in walking, not elsewhere classified: Secondary | ICD-10-CM | POA: Insufficient documentation

## 2020-11-19 NOTE — Therapy (Signed)
[facility] [address] [place], [place], [address] Phone: [phone]   Fax:  [phone]  Physical Therapy Evaluation  Patient Details  Name: Blake Rhodes MRN: [id number] Date of Birth: [birth year] Referring Provider (PT): Clare Gandy, MD   Encounter Date: 11/19/2020   PT End of Session - 11/19/20 1201    Visit Number 1    Number of Visits 9    Date for PT Re-Evaluation 12/18/20    Authorization Type [facility] U[facility]    PT Start Time 1200   pt arrived late   PT Stop Time 1230    PT Time Calculation (min) 30 min    Activity Tolerance Patient tolerated treatment well    Behavior During Therapy [facility] for tasks assessed/performed           Past Medical History:  Diagnosis Date  . Acute low back pain with sciatica   . Hyperlipidemia   . Sciatica bACK     Past Surgical History:  Procedure Laterality Date  . TONSILLECTOMY      There were no vitals filed for this visit.    Subjective Assessment - 11/19/20 1202    Subjective Shockwave therapy was veryhelpful to Rt heel. Next AM had pain in Left heel.  Was doing seated heel raises which helped for about an hour to walk normally. May consider needling for calcification removal. In boot and on crutches. Will visit idea of cortisone injection PRN. goal back to work on Mon without limping.              [facility] PT Assessment - 11/19/20 0001      Assessment   Medical Diagnosis Lt heel pain    Referring Provider (PT) Clare Gandy, MD    Onset Date/Surgical Date --   chronic   Hand Dominance Right    Prior Therapy yes      Precautions   Precautions None      Restrictions   Weight Bearing Restrictions No      Balance Screen   Has the patient fallen in the past 6 months No      Home Environment   Living Environment Private residence    Additional Comments stairs      Prior Function   Level of Independence Independent    Vocation Full time employment      Cognition    Overall Cognitive Status Within Functional Limits for tasks assessed      Sensation   Additional Comments WFL      ROM / Strength   AROM / PROM / Strength PROM      AROM   Left Ankle Dorsiflexion 4      PROM   PROM Assessment Site Ankle    Right/Left Ankle Left    Left Ankle Dorsiflexion 6      Palpation   Palpation comment TTP distal, lateral achilles insertion      Ambulation/Gait   Gait Comments NWB bil axillary crutches and cam boot                      Objective measurements completed on examination: See above findings.       [facility] Adult PT Treatment/Exercise - 11/19/20 0001      Ultrasound   Ultrasound Location Lt heel    Ultrasound Parameters 50%    Ultrasound Goals Pain      Iontophoresis   Type of Iontophoresis Dexamethasone    Location Lt heel  Dose 1cc    Time 6 hr wear      Manual Therapy   Manual therapy comments skilled palpation and monitoring during TPDN    Soft tissue mobilization gastroc            Trigger Point Dry Needling - 11/19/20 0001    Gastrocnemius Response Twitch response elicited;Palpable increased muscle length   lateral belly               PT Education - 11/19/20 1438    Education Details anatomy of condition, exercises & progression, HEP    Person(s) Educated Patient    Methods Explanation    Comprehension Verbalized understanding;Need further instruction            PT Short Term Goals - 09/15/20 1733      PT SHORT TERM GOAL #1   Title STG=LTG             PT Long Term Goals - 11/19/20 1420      PT LONG TERM GOAL #1   Title Able to ambulate in tennis shoes    Baseline in boot and crutches today    Time 4    Period Weeks    Status New    Target Date 12/18/20      PT LONG TERM GOAL #2   Title independent in well rounded gym program to include stretching and strengthening    Baseline was not stretching as well as he should    Time 4    Period Weeks    Status New    Target Date  12/18/20      PT LONG TERM GOAL #3   Title ankle PROM to 10 deg    Baseline see flowsheet    Time 4    Period Weeks    Status New    Target Date 12/18/20                  Plan - 11/19/20 1416    Clinical Impression Statement Presents to PT with c/o Lt post/lat heel pain. I have worked with him in the past for similar issue. Success in the past with ionto+US for pain mgmt. DN also to lateral gastroc to decrease excessive tension. Wearing boot and using crutches- advised him to walk as tolerated in boot and try tennis shoe if tolerated. Avoid heavy stretching and focus on AROM and weight bearing in standing.    Personal Factors and Comorbidities Time since onset of injury/illness/exacerbation    Examination-Activity Limitations Sit;Sleep;Squat;Stairs;Stand;Dressing;Locomotion Level    Examination-Participation Restrictions Occupation;Community Activity;Driving    Stability/Clinical Decision Making Stable/Uncomplicated    Clinical Decision Making Low    Rehab Potential Good    PT Frequency --   1-2/week   PT Duration 4 weeks    PT Treatment/Interventions ADLs/Self Care Home Management;Cryotherapy;Electrical Stimulation;Ultrasound;Moist Heat;Iontophoresis 4mg /ml Dexamethasone;Functional mobility training;Therapeutic activities;Therapeutic exercise;Patient/family education;Neuromuscular re-education;Manual techniques;Passive range of motion;Dry needling;Taping;Gait training;Vasopneumatic Device;Aquatic Therapy;Stair training;Joint Manipulations;Balance training    PT Next Visit Plan continue manual & modalities PRN, CKC stability as tolerated    PT Home Exercise Plan ankle AROM, ice TID    Consulted and Agree with Plan of Care Patient           Patient will benefit from skilled therapeutic intervention in order to improve the following deficits and impairments:  Abnormal gait,Difficulty walking,Pain,Impaired flexibility  Visit Diagnosis: Pain in left ankle and joints of left foot  - Plan: PT plan of care cert/re-cert  Difficulty in walking,  not elsewhere classified - Plan: PT plan of care cert/re-cert     Problem List Patient Active Problem List   Diagnosis Date Noted  . Heel spur, left 11/12/2020  . Achilles tendon pain 09/24/2020  . Dyslipidemia 03/26/2019  . Low back pain 03/25/2019    Chinyere Galiano C. Kaydan Wilhoite PT, [name] 11/19/20 7:50 PM   [facility] [address] [place], [place], [address] Phone: [phone]   Fax:  [phone]  Name: Kasai Beltran MRN: [id number] Date of Birth: 1966/02/12

## 2020-11-20 ENCOUNTER — Other Ambulatory Visit: Payer: Self-pay

## 2020-11-20 ENCOUNTER — Ambulatory Visit (HOSPITAL_BASED_OUTPATIENT_CLINIC_OR_DEPARTMENT_OTHER): Payer: 59 | Attending: Family Medicine | Admitting: Physical Therapy

## 2020-11-20 ENCOUNTER — Encounter (HOSPITAL_BASED_OUTPATIENT_CLINIC_OR_DEPARTMENT_OTHER): Payer: Self-pay | Admitting: Physical Therapy

## 2020-11-20 DIAGNOSIS — R262 Difficulty in walking, not elsewhere classified: Secondary | ICD-10-CM | POA: Diagnosis not present

## 2020-11-20 DIAGNOSIS — M25572 Pain in left ankle and joints of left foot: Secondary | ICD-10-CM | POA: Diagnosis not present

## 2020-11-20 NOTE — Therapy (Signed)
[facility] [address] [place], [place], [address] Phone: [phone]   Fax:  [phone]  Physical Therapy Treatment  Patient Details  Name: Blake Rhodes MRN: [id number] Date of Birth: 12/11/1965 Referring Provider (PT): Clare Gandy, MD   Encounter Date: 11/20/2020   PT End of Session - 11/20/20 1542    Visit Number 2    Number of Visits 9    Date for PT Re-Evaluation 12/18/20    Authorization Type [facility] U[facility]    PT Start Time 1250    PT Stop Time 1325    PT Time Calculation (min) 35 min    Activity Tolerance Patient tolerated treatment well    Behavior During Therapy [facility] for tasks assessed/performed           Past Medical History:  Diagnosis Date  . Acute low back pain with sciatica   . Hyperlipidemia   . Sciatica bACK     Past Surgical History:  Procedure Laterality Date  . TONSILLECTOMY      There were no vitals filed for this visit.   Subjective Assessment - 11/20/20 1540    Subjective Feeling pretty good but still using crutches and boot. Began feeling some pain in Rt heel this AM- used ice and stretched.    Patient Stated Goals to walk without pain, return to gym activity                             [facility] Adult PT Treatment/Exercise - 11/20/20 0001      Ankle Exercises: Standing   Other Standing Ankle Exercises gait training: heel strike, hip ext, toe off                  PT Education - 11/20/20 1542    Education Details management & avoiding overdoing    Person(s) Educated Patient    Methods Explanation    Comprehension Verbalized understanding;Need further instruction            PT Short Term Goals - 09/15/20 1733      PT SHORT TERM GOAL #1   Title STG=LTG             PT Long Term Goals - 11/19/20 1420      PT LONG TERM GOAL #1   Title Able to ambulate in tennis shoes    Baseline in boot and crutches today    Time 4    Period Weeks    Status New     Target Date 12/18/20      PT LONG TERM GOAL #2   Title independent in well rounded gym program to include stretching and strengthening    Baseline was not stretching as well as he should    Time 4    Period Weeks    Status New    Target Date 12/18/20      PT LONG TERM GOAL #3   Title ankle PROM to 10 deg    Baseline see flowsheet    Time 4    Period Weeks    Status New    Target Date 12/18/20                 Plan - 11/20/20 1543    Clinical Impression Statement Placed ionto patch on heel again today. Good posture through bil feet when standing and was able to stand without pain. Amb with flat foot gait and decreased hip ext  for toe off. We worked on lengthening stride to reduce abnormal pressures. Advised that he will need to ice every couple of hours as well as walk short distances frequently through the weekend as well as when he goes back to work- avoid sitting for long periods.    PT Treatment/Interventions ADLs/Self Care Home Management;Cryotherapy;Electrical Stimulation;Ultrasound;Moist Heat;Iontophoresis 4mg /ml Dexamethasone;Functional mobility training;Therapeutic activities;Therapeutic exercise;Patient/family education;Neuromuscular re-education;Manual techniques;Passive range of motion;Dry needling;Taping;Gait training;Vasopneumatic Device;Aquatic Therapy;Stair training;Joint Manipulations;Balance training    PT Next Visit Plan continue manual & modalities PRN, CKC stability as tolerated    PT Home Exercise Plan ankle AROM, ice TID    Consulted and Agree with Plan of Care Patient           Patient will benefit from skilled therapeutic intervention in order to improve the following deficits and impairments:  Abnormal gait,Difficulty walking,Pain,Impaired flexibility  Visit Diagnosis: Pain in left ankle and joints of left foot  Difficulty in walking, not elsewhere classified     Problem List Patient Active Problem List   Diagnosis Date Noted  . Heel spur,  left 11/12/2020  . Achilles tendon pain 09/24/2020  . Dyslipidemia 03/26/2019  . Low back pain 03/25/2019    Blake Rhodes C. Carleah Yablonski PT, [name] 11/20/20 3:47 PM   [facility] [address] [place], [place], [place] Phone: [phone]   Fax:  [phone]  Name: Blake Rhodes MRN: Eilene Ghazi Date of Birth: 1965/10/25

## 2020-11-23 ENCOUNTER — Encounter (HOSPITAL_BASED_OUTPATIENT_CLINIC_OR_DEPARTMENT_OTHER): Payer: Self-pay | Admitting: Physical Therapy

## 2020-11-23 ENCOUNTER — Encounter: Payer: Self-pay | Admitting: *Deleted

## 2020-11-23 ENCOUNTER — Ambulatory Visit (HOSPITAL_BASED_OUTPATIENT_CLINIC_OR_DEPARTMENT_OTHER): Payer: 59 | Attending: Family Medicine | Admitting: Physical Therapy

## 2020-11-23 ENCOUNTER — Other Ambulatory Visit: Payer: Self-pay

## 2020-11-23 ENCOUNTER — Ambulatory Visit: Payer: Self-pay | Admitting: Family Medicine

## 2020-11-23 ENCOUNTER — Encounter: Payer: Self-pay | Admitting: Family Medicine

## 2020-11-23 DIAGNOSIS — R262 Difficulty in walking, not elsewhere classified: Secondary | ICD-10-CM | POA: Insufficient documentation

## 2020-11-23 DIAGNOSIS — M25572 Pain in left ankle and joints of left foot: Secondary | ICD-10-CM | POA: Insufficient documentation

## 2020-11-23 DIAGNOSIS — M7660 Achilles tendinitis, unspecified leg: Secondary | ICD-10-CM

## 2020-11-23 NOTE — Progress Notes (Signed)
  Blake Rhodes - 55 y.o. male MRN [id number]  Date of birth: [birth year]  SUBJECTIVE:  Including CC & ROS.  No chief complaint on file.   Blake Rhodes is a 55 y.o. male that is here for his third shockwave therapy.    Review of Systems See HPI   HISTORY: Past Medical, Surgical, Social, and Family History Reviewed & Updated per EMR.   Pertinent Historical Findings include:  Past Medical History:  Diagnosis Date  . Acute low back pain with sciatica   . Hyperlipidemia   . Sciatica bACK     Past Surgical History:  Procedure Laterality Date  . TONSILLECTOMY      Family History  Problem Relation Age of Onset  . Diabetes Mother   . Dementia Father   . High blood pressure Father   . Hypertension Father   . Mental illness Father   . Diabetes Maternal Grandfather   . Hypertension Paternal Grandmother   . Heart disease Paternal Grandfather     Social History   Socioeconomic History  . Marital status: Married    Spouse name: Not on file  . Number of children: Not on file  . Years of education: Not on file  . Highest education level: Not on file  Occupational History  . Not on file  Tobacco Use  . Smoking status: Never Smoker  . Smokeless tobacco: Never Used  Vaping Use  . Vaping Use: Never used  Substance and Sexual Activity  . Alcohol use: Yes    Alcohol/week: 2.0 standard drinks    Types: 2 Cans of beer per week    Comment: socially  . Drug use: No  . Sexual activity: Yes  Other Topics Concern  . Not on file  Social History Narrative  . Not on file   Social Determinants of Health   Financial Resource Strain: Not on file  Food Insecurity: Not on file  Transportation Needs: Not on file  Physical Activity: Not on file  Stress: Not on file  Social Connections: Not on file  Intimate Partner Violence: Not on file     PHYSICAL EXAM:  VS: Ht 5\' 10"  (1.778 m)   Wt 175 lb (79.4 kg)   BMI 25.11 kg/m  Physical Exam Gen: NAD, alert, cooperative with exam,  well-appearing   ECSWT Note Blake Rhodes 13-Feb-1966  Procedure: ECSWT Indications: right achilles pain  Procedure Details Consent: Risks of procedure as well as the alternatives and risks of each were explained to the (patient/caregiver).  Consent for procedure obtained. Time Out: Verified patient identification, verified procedure, site/side was marked, verified correct patient position, special equipment/implants available, medications/allergies/relevent history reviewed, required imaging and test results available.  Performed.  The area was cleaned with iodine and alcohol swabs.    The right achilles and heel was targeted for Extracorporeal shockwave therapy.   Preset: heel spur Power Level: 80 Frequency: 10 Impulse/cycles: 2500 Head size: large    Patient did tolerate procedure well.     ASSESSMENT & PLAN:   Achilles tendon pain Completed third shockwave - f/u in one week.

## 2020-11-23 NOTE — Therapy (Signed)
[facility] [address] [place], [place], [address] Phone: [phone]   Fax:  [phone]  Physical Therapy Treatment  Patient Details  Name: Blake Rhodes MRN: [id number] Date of Birth: 12/20/1965 Referring Provider (PT): Clare Gandy, MD   Encounter Date: 11/23/2020   PT End of Session - 11/23/20 1244    Visit Number 3    Number of Visits 9    Date for PT Re-Evaluation 12/18/20    Authorization Type [facility] UMR    PT Start Time 1239    PT Stop Time 1310    PT Time Calculation (min) 31 min    Activity Tolerance Patient tolerated treatment well    Behavior During Therapy [facility] for tasks assessed/performed           Past Medical History:  Diagnosis Date  . Acute low back pain with sciatica   . Hyperlipidemia   . Sciatica bACK     Past Surgical History:  Procedure Laterality Date  . TONSILLECTOMY      There were no vitals filed for this visit.   Subjective Assessment - 11/23/20 1243    Subjective Tension in bil gastroc, a couple found in lateral Right. i can do AROM on Lt side without pain, mild in Rt. Walking much better in oofos sandals; cole haans are not very comfortable and have to walk slowly.    Patient Stated Goals to walk without pain, return to gym activity    Currently in Pain? Yes    Pain Score 2     Pain Location Heel    Pain Orientation Right    Pain Descriptors / Indicators Sharp                             [facility] Adult PT Treatment/Exercise - 11/23/20 0001      Iontophoresis   Type of Iontophoresis Dexamethasone    Location bil heel, lateral aspect    Dose 1cc each    Time 6 hr wear      Manual Therapy   Manual Therapy Joint mobilization    Manual therapy comments manual stretching, skilled palpation and monitoring during TPDN    Soft tissue mobilization IASTM bil gastrocs            Trigger Point Dry Needling - 11/23/20 0001    Gastrocnemius Response Twitch response  elicited;Palpable increased muscle length   bil lat bellies               PT Education - 11/23/20 1934    Education Details 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, Paramedic) Educated Patient    Methods Explanation    Comprehension Verbalized understanding;Need further instruction            PT Short Term Goals - 09/15/20 1733      PT SHORT TERM GOAL #1   Title STG=LTG             PT Long Term Goals - 11/19/20 1420      PT LONG TERM GOAL #1   Title Able to ambulate in tennis shoes    Baseline in boot and crutches today    Time 4    Period Weeks    Status New    Target Date 12/18/20      PT LONG TERM GOAL #2   Title independent in well rounded gym program to include stretching and  strengthening    Baseline was not stretching as well as he should    Time 4    Period Weeks    Status New    Target Date 12/18/20      PT LONG TERM GOAL #3   Title ankle PROM to 10 deg    Baseline see flowsheet    Time 4    Period Weeks    Status New    Target Date 12/18/20                 Plan - 11/23/20 1930    Clinical Impression Statement able to walk in oofos sandals comfortably and reported minimal pain in Rt side today with no pain in Lt. tightness in gastrocs notable and decr with DN & IASTM.    PT Treatment/Interventions ADLs/Self Care Home Management;Cryotherapy;Electrical Stimulation;Ultrasound;Moist Heat;Iontophoresis 4mg /ml Dexamethasone;Functional mobility training;Therapeutic activities;Therapeutic exercise;Patient/family education;Neuromuscular re-education;Manual techniques;Passive range of motion;Dry needling;Taping;Gait training;Vasopneumatic Device;Aquatic Therapy;Stair training;Joint Manipulations;Balance training    PT Next Visit Plan continue manual & modalities PRN, CKC stability as tolerated    PT Home Exercise Plan ankle AROM, ice TID    Consulted and Agree with Plan of Care Patient           Patient will  benefit from skilled therapeutic intervention in order to improve the following deficits and impairments:  Abnormal gait,Difficulty walking,Pain,Impaired flexibility  Visit Diagnosis: Pain in left ankle and joints of left foot  Difficulty in walking, not elsewhere classified     Problem List Patient Active Problem List   Diagnosis Date Noted  . Heel spur, left 11/12/2020  . Achilles tendon pain 09/24/2020  . Dyslipidemia 03/26/2019  . Low back pain 03/25/2019  Rosabelle Jupin C. Darriona Dehaas PT, [name] 11/23/20 7:36 PM   [facility] [address] [place], [place], [place] Phone: [phone]   Fax:  [phone]  Name: Labaron Digirolamo MRN: Eilene Ghazi Date of Birth: 04-22-1966

## 2020-11-24 NOTE — Assessment & Plan Note (Signed)
Completed third shockwave - f/u in one week.

## 2020-11-25 ENCOUNTER — Ambulatory Visit: Payer: 59 | Admitting: Family Medicine

## 2020-11-25 ENCOUNTER — Encounter (HOSPITAL_BASED_OUTPATIENT_CLINIC_OR_DEPARTMENT_OTHER): Payer: Self-pay | Admitting: Physical Therapy

## 2020-11-25 ENCOUNTER — Other Ambulatory Visit: Payer: Self-pay

## 2020-11-25 ENCOUNTER — Ambulatory Visit (HOSPITAL_BASED_OUTPATIENT_CLINIC_OR_DEPARTMENT_OTHER): Payer: 59 | Attending: Family Medicine | Admitting: Physical Therapy

## 2020-11-25 DIAGNOSIS — R262 Difficulty in walking, not elsewhere classified: Secondary | ICD-10-CM | POA: Insufficient documentation

## 2020-11-25 DIAGNOSIS — M25572 Pain in left ankle and joints of left foot: Secondary | ICD-10-CM | POA: Insufficient documentation

## 2020-11-25 NOTE — Therapy (Signed)
[facility] [address] [place], [place], [address] Phone: [phone]   Fax:  [phone]  Physical Therapy Treatment  Patient Details  Name: Blake Rhodes MRN: [id number] Date of Birth: Dec 27, 1965 Referring Provider (PT): Clare Gandy, MD   Encounter Date: 11/25/2020   PT End of Session - 11/25/20 1329    Visit Number 4    Number of Visits 9    Date for PT Re-Evaluation 12/18/20    Authorization Type [facility]    PT Start Time 1321    PT Stop Time 1341    PT Time Calculation (min) 20 min    Activity Tolerance Patient tolerated treatment well    Behavior During Therapy [facility] for tasks assessed/performed           Past Medical History:  Diagnosis Date  . Acute low back pain with sciatica   . Hyperlipidemia   . Sciatica bACK     Past Surgical History:  Procedure Laterality Date  . TONSILLECTOMY      There were no vitals filed for this visit.   Subjective Assessment - 11/25/20 1323    Subjective Wearing oofos shoes which help. 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 I didnt move for about 1.5 hr and was able to get up and move okay.    Patient Stated Goals to walk without pain, return to gym activity    Currently in Pain? No/denies                             [facility] Adult PT Treatment/Exercise - 11/25/20 0001      Ankle Exercises: Stretches   Soleus Stretch Limitations long sitting with strap, towel under kne    Gastroc Stretch Limitations long sitting with strap                    PT Short Term Goals - 09/15/20 1733      PT SHORT TERM GOAL #1   Title STG=LTG             PT Long Term Goals - 11/19/20 1420      PT LONG TERM GOAL #1   Title Able to ambulate in tennis shoes    Baseline in boot and crutches today    Time 4    Period Weeks    Status New    Target Date 12/18/20      PT LONG TERM GOAL #2   Title independent in well rounded gym  program to include stretching and strengthening    Baseline was not stretching as well as he should    Time 4    Period Weeks    Status New    Target Date 12/18/20      PT LONG TERM GOAL #3   Title ankle PROM to 10 deg    Baseline see flowsheet    Time 4    Period Weeks    Status New    Target Date 12/18/20                 Plan - 11/25/20 2128    Clinical Impression Statement Asked pt to return to gentle passive stretching as well as begin biking and walking but short duration to avoid pain spike in the next day. He is continuing with treatments to heels with [facility]. Did not utilize ionto today because he did not  have pain or swelling notable.    PT Treatment/Interventions ADLs/Self Care Home Management;Cryotherapy;Electrical Stimulation;Ultrasound;Moist Heat;Iontophoresis 4mg /ml Dexamethasone;Functional mobility training;Therapeutic activities;Therapeutic exercise;Patient/family education;Neuromuscular re-education;Manual techniques;Passive range of motion;Dry needling;Taping;Gait training;Vasopneumatic Device;Aquatic Therapy;Stair training;Joint Manipulations;Balance training    PT Home Exercise Plan ankle AROM, ice TID, gastroc & soleus stretch    Consulted and Agree with Plan of Care Patient           Patient will benefit from skilled therapeutic intervention in order to improve the following deficits and impairments:  Abnormal gait,Difficulty walking,Pain,Impaired flexibility  Visit Diagnosis: Pain in left ankle and joints of left foot  Difficulty in walking, not elsewhere classified     Problem List Patient Active Problem List   Diagnosis Date Noted  . Heel spur, left 11/12/2020  . Achilles tendon pain 09/24/2020  . Dyslipidemia 03/26/2019  . Low back pain 03/25/2019    [name], [name] 11/25/20 9:34 PM   [facility] [address] [place], [place], [place] Phone: [phone]   Fax:   [phone]  Name: Blake Rhodes MRN: Eilene Ghazi Date of Birth: Feb 26, 1966

## 2020-11-25 NOTE — Progress Notes (Deleted)
  Blake Rhodes - 55 y.o. male MRN [id number]  Date of birth: 1966-02-10  SUBJECTIVE:  Including CC & ROS.  No chief complaint on file.   Blake Rhodes is a 55 y.o. male that is  ***.  ***   Review of Systems See HPI   HISTORY: Past Medical, Surgical, Social, and Family History Reviewed & Updated per EMR.   Pertinent Historical Findings include:  Past Medical History:  Diagnosis Date  . Acute low back pain with sciatica   . Hyperlipidemia   . Sciatica bACK     Past Surgical History:  Procedure Laterality Date  . TONSILLECTOMY      Family History  Problem Relation Age of Onset  . Diabetes Mother   . Dementia Father   . High blood pressure Father   . Hypertension Father   . Mental illness Father   . Diabetes Maternal Grandfather   . Hypertension Paternal Grandmother   . Heart disease Paternal Grandfather     Social History   Socioeconomic History  . Marital status: Married    Spouse name: Not on file  . Number of children: Not on file  . Years of education: Not on file  . Highest education level: Not on file  Occupational History  . Not on file  Tobacco Use  . Smoking status: Never Smoker  . Smokeless tobacco: Never Used  Vaping Use  . Vaping Use: Never used  Substance and Sexual Activity  . Alcohol use: Yes    Alcohol/week: 2.0 standard drinks    Types: 2 Cans of beer per week    Comment: socially  . Drug use: No  . Sexual activity: Yes  Other Topics Concern  . Not on file  Social History Narrative  . Not on file   Social Determinants of Health   Financial Resource Strain: Not on file  Food Insecurity: Not on file  Transportation Needs: Not on file  Physical Activity: Not on file  Stress: Not on file  Social Connections: Not on file  Intimate Partner Violence: Not on file     PHYSICAL EXAM:  VS: There were no vitals taken for this visit. Physical Exam Gen: NAD, alert, cooperative with exam, well-appearing MSK:  ***      ASSESSMENT &  PLAN:   No problem-specific Assessment & Plan notes found for this encounter.

## 2020-12-04 ENCOUNTER — Ambulatory Visit (HOSPITAL_BASED_OUTPATIENT_CLINIC_OR_DEPARTMENT_OTHER): Payer: 59 | Attending: Family Medicine | Admitting: Physical Therapy

## 2020-12-04 ENCOUNTER — Encounter (HOSPITAL_BASED_OUTPATIENT_CLINIC_OR_DEPARTMENT_OTHER): Payer: Self-pay | Admitting: Physical Therapy

## 2020-12-04 ENCOUNTER — Other Ambulatory Visit: Payer: Self-pay

## 2020-12-04 DIAGNOSIS — M25572 Pain in left ankle and joints of left foot: Secondary | ICD-10-CM | POA: Insufficient documentation

## 2020-12-04 DIAGNOSIS — M25571 Pain in right ankle and joints of right foot: Secondary | ICD-10-CM | POA: Insufficient documentation

## 2020-12-04 DIAGNOSIS — R262 Difficulty in walking, not elsewhere classified: Secondary | ICD-10-CM | POA: Insufficient documentation

## 2020-12-04 NOTE — Therapy (Signed)
[facility] [address] [place], [place], [address] Phone: [phone]   Fax:  [phone]  Physical Therapy Treatment  Patient Details  Name: Blake Rhodes MRN: [id number] Date of Birth: [birth year] Referring Provider (PT): Clare Gandy, MD   Encounter Date: 12/04/2020   PT End of Session - 12/04/20 0856    Visit Number 5    Number of Visits 9    Date for PT Re-Evaluation 12/18/20    Authorization Type [facility]    PT Start Time 0830    PT Stop Time 0850    PT Time Calculation (min) 20 min    Activity Tolerance Patient tolerated treatment well    Behavior During Therapy [facility] for tasks assessed/performed           Past Medical History:  Diagnosis Date  . Acute low back pain with sciatica   . Hyperlipidemia   . Sciatica bACK     Past Surgical History:  Procedure Laterality Date  . TONSILLECTOMY      There were no vitals filed for this visit.   Subjective Assessment - 12/04/20 0831    Subjective Was doing really well until I wore dress shoes yesterday and now Lt achilles is TTP on lateral, distal aspect and hurts to walk. stretching felt good. about 1.5 hr later pain came back.    Currently in Pain? Yes                             [facility] Adult PT Treatment/Exercise - 12/04/20 0001      Iontophoresis   Type of Iontophoresis Dexamethasone    Location Lt lateral, distal achilles    Dose 1cc    Time 6 hr wear      Manual Therapy   Soft tissue mobilization Lt gastroc with DF active stretch                  PT Education - 12/04/20 1544    Education Details shoes, exercise    Person(s) Educated Patient    Methods Explanation    Comprehension Verbalized understanding;Need further instruction            PT Short Term Goals - 09/15/20 1733      PT SHORT TERM GOAL #1   Title STG=LTG             PT Long Term Goals - 11/19/20 1420      PT LONG TERM GOAL #1   Title Able to  ambulate in tennis shoes    Baseline in boot and crutches today    Time 4    Period Weeks    Status New    Target Date 12/18/20      PT LONG TERM GOAL #2   Title independent in well rounded gym program to include stretching and strengthening    Baseline was not stretching as well as he should    Time 4    Period Weeks    Status New    Target Date 12/18/20      PT LONG TERM GOAL #3   Title ankle PROM to 10 deg    Baseline see flowsheet    Time 4    Period Weeks    Status New    Target Date 12/18/20                 Plan - 12/04/20 1542    Clinical Impression  Statement manual therapy to decrease calf tightness paired with stretching. discussed shoe wear and requested that he avoid wearing the stiff shoes for a while as he has flare ups when he wears them. felt good doing bike so he will continue with that for low-impact WB.    PT Treatment/Interventions ADLs/Self Care Home Management;Cryotherapy;Electrical Stimulation;Ultrasound;Moist Heat;Iontophoresis 4mg /ml Dexamethasone;Functional mobility training;Therapeutic activities;Therapeutic exercise;Patient/family education;Neuromuscular re-education;Manual techniques;Passive range of motion;Dry needling;Taping;Gait training;Vasopneumatic Device;Aquatic Therapy;Stair training;Joint Manipulations;Balance training    PT Next Visit Plan continue manual & modalities PRN, CKC stability as tolerated    PT Home Exercise Plan ankle AROM, ice TID, gastroc & soleus stretch    Consulted and Agree with Plan of Care Patient           Patient will benefit from skilled therapeutic intervention in order to improve the following deficits and impairments:  Abnormal gait,Difficulty walking,Pain,Impaired flexibility  Visit Diagnosis: Pain in left ankle and joints of left foot  Difficulty in walking, not elsewhere classified  Pain in right ankle and joints of right foot     Problem List Patient Active Problem List   Diagnosis Date Noted   . Heel spur, left 11/12/2020  . Achilles tendon pain 09/24/2020  . Dyslipidemia 03/26/2019  . Low back pain 03/25/2019   [name] PT, [name] 12/04/20 3:45 PM   [facility] [address] [place], [place], [place] Phone: [phone]   Fax:  [phone]  Name: Blake Rhodes MRN: Eilene Ghazi Date of Birth: 1965-09-10

## 2020-12-08 ENCOUNTER — Other Ambulatory Visit: Payer: Self-pay

## 2020-12-08 ENCOUNTER — Encounter (HOSPITAL_BASED_OUTPATIENT_CLINIC_OR_DEPARTMENT_OTHER): Payer: Self-pay | Admitting: Physical Therapy

## 2020-12-08 ENCOUNTER — Ambulatory Visit (HOSPITAL_BASED_OUTPATIENT_CLINIC_OR_DEPARTMENT_OTHER): Payer: 59 | Admitting: Physical Therapy

## 2020-12-08 DIAGNOSIS — M25572 Pain in left ankle and joints of left foot: Secondary | ICD-10-CM

## 2020-12-08 DIAGNOSIS — M25571 Pain in right ankle and joints of right foot: Secondary | ICD-10-CM | POA: Diagnosis not present

## 2020-12-08 DIAGNOSIS — R262 Difficulty in walking, not elsewhere classified: Secondary | ICD-10-CM

## 2020-12-08 NOTE — Therapy (Addendum)
[facility] [place], [place], [address] Phone: [phone]   Fax:  [phone]  Physical Therapy Treatment  Patient Details  Name: Blake Rhodes MRN: [id number] Date of Birth: 1965-09-09 Referring Provider (PT): Clearance Coots, MD   Encounter Date: 12/08/2020   PT End of Session - 12/08/20 1221     Visit Number 6    Number of Visits 9    Date for PT Re-Evaluation 12/18/20    Authorization Type [facility]    PT Start Time 4010    PT Stop Time 1221    PT Time Calculation (min) 23 min    Activity Tolerance Patient tolerated treatment well    Behavior During Therapy [name] for tasks assessed/performed             Past Medical History:  Diagnosis Date   Acute low back pain with sciatica    Hyperlipidemia    Sciatica bACK     Past Surgical History:  Procedure Laterality Date   TONSILLECTOMY      There were no vitals filed for this visit.   Subjective Assessment - 12/08/20 1159     Subjective LAst thurs began having pain when I wore work shoes. OOfos did not help. wearing tennis shoes today which are better. more shockwave therapy tomorrow. have been doing eliptical. I notice on weekend when I sleep more it feels better. have been doing heat pads.    Currently in Pain? No/denies    Aggravating Factors  walking depends                                         PT Short Term Goals - 09/15/20 1733       PT SHORT TERM GOAL #1   Title STG=LTG               PT Long Term Goals - 11/19/20 1420       PT LONG TERM GOAL #1   Title Able to ambulate in tennis shoes    Baseline in boot and crutches today    Time 4    Period Weeks    Status New    Target Date 12/18/20      PT LONG TERM GOAL #2   Title independent in well rounded gym program to include stretching and strengthening    Baseline was not stretching as well as he should    Time 4    Period Weeks    Status New     Target Date 12/18/20      PT LONG TERM GOAL #3   Title ankle PROM to 10 deg    Baseline see flowsheet    Time 4    Period Weeks    Status New    Target Date 12/18/20                   Plan - 12/08/20 1221     Clinical Impression Statement Discussed a daily HEP and gym program to continue with. Is going to try stretch zone and continue with treatments at [facility], regular massages. will contct me with needs/questions. No need for ionto today.    PT Treatment/Interventions ADLs/Self Care Home Management;Cryotherapy;Electrical Stimulation;Ultrasound;Moist Heat;Iontophoresis 4mg /ml Dexamethasone;Functional mobility training;Therapeutic activities;Therapeutic exercise;Patient/family education;Neuromuscular re-education;Manual techniques;Passive range of motion;Dry needling;Taping;Gait training;Vasopneumatic Device;Aquatic Therapy;Stair training;Joint Manipulations;Balance training    PT Home Exercise Plan ankle AROM,  ice TID, gastroc & soleus stretch, [id number]    Consulted and Agree with Plan of Care Patient             Patient will benefit from skilled therapeutic intervention in order to improve the following deficits and impairments:  Abnormal gait,Difficulty walking,Pain,Impaired flexibility  Visit Diagnosis: Pain in left ankle and joints of left foot  Difficulty in walking, not elsewhere classified     Problem List Patient Active Problem List   Diagnosis Date Noted   Heel spur, left 11/12/2020   Achilles tendon pain 09/24/2020   Dyslipidemia 03/26/2019   Low back pain 03/25/2019   Blake Rhodes C. Holley Kocurek PT, [name] 12/08/20 12:36 PM   [facility] [place], [place], [address] Phone: [phone]   Fax:  [phone]  Name: Blake Rhodes MRN: [id number] Date of Birth: Dec 05, 1965  PHYSICAL THERAPY DISCHARGE SUMMARY  Visits from Start of Care: 6  Current functional level related to goals / functional  outcomes: See above   Remaining deficits: See above   Education / Equipment: Anatomy of condition, POC, HEP, exercise form/rationale   Patient agrees to discharge. Patient goals were met. Patient is being discharged due to being pleased with the current functional level. Blake Rhodes C. Martese Vanatta PT, [name] 01/29/21 12:43 PM

## 2020-12-09 ENCOUNTER — Other Ambulatory Visit: Payer: Self-pay

## 2020-12-09 ENCOUNTER — Ambulatory Visit: Payer: Self-pay | Admitting: Family Medicine

## 2020-12-09 ENCOUNTER — Encounter: Payer: Self-pay | Admitting: Family Medicine

## 2020-12-09 DIAGNOSIS — M7732 Calcaneal spur, left foot: Secondary | ICD-10-CM

## 2020-12-09 DIAGNOSIS — M7660 Achilles tendinitis, unspecified leg: Secondary | ICD-10-CM

## 2020-12-09 NOTE — Progress Notes (Signed)
Blake Rhodes - 55 y.o. male MRN [id number]  Date of birth: 12/06/1965  SUBJECTIVE:  Including CC & ROS.  No chief complaint on file.   Blake Rhodes is a 55 y.o. male that is right and left heel ECSWT.    Review of Systems See HPI   HISTORY: Past Medical, Surgical, Social, and Family History Reviewed & Updated per EMR.   Pertinent Historical Findings include:  Past Medical History:  Diagnosis Date  . Acute low back pain with sciatica   . Hyperlipidemia   . Sciatica bACK     Past Surgical History:  Procedure Laterality Date  . TONSILLECTOMY      Family History  Problem Relation Age of Onset  . Diabetes Mother   . Dementia Father   . High blood pressure Father   . Hypertension Father   . Mental illness Father   . Diabetes Maternal Grandfather   . Hypertension Paternal Grandmother   . Heart disease Paternal Grandfather     Social History   Socioeconomic History  . Marital status: Married    Spouse name: Not on file  . Number of children: Not on file  . Years of education: Not on file  . Highest education level: Not on file  Occupational History  . Not on file  Tobacco Use  . Smoking status: Never Smoker  . Smokeless tobacco: Never Used  Vaping Use  . Vaping Use: Never used  Substance and Sexual Activity  . Alcohol use: Yes    Alcohol/week: 2.0 standard drinks    Types: 2 Cans of beer per week    Comment: socially  . Drug use: No  . Sexual activity: Yes  Other Topics Concern  . Not on file  Social History Narrative  . Not on file   Social Determinants of Health   Financial Resource Strain: Not on file  Food Insecurity: Not on file  Transportation Needs: Not on file  Physical Activity: Not on file  Stress: Not on file  Social Connections: Not on file  Intimate Partner Violence: Not on file     PHYSICAL EXAM:  VS: Ht 5\' 10"  (1.778 m)   Wt 175 lb (79.4 kg)   BMI 25.11 kg/m  Physical Exam Gen: NAD, alert, cooperative with exam, well-appearing    ECSWT Note Blake Rhodes 1965/10/18  Procedure: ECSWT Indications: Right heel pain   Procedure Details Consent: Risks of procedure as well as the alternatives and risks of each were explained to the (patient/caregiver).  Consent for procedure obtained. Time Out: Verified patient identification, verified procedure, site/side was marked, verified correct patient position, special equipment/implants available, medications/allergies/relevent history reviewed, required imaging and test results available.  Performed.  The area was cleaned with iodine and alcohol swabs.    The right heel pain was targeted for Extracorporeal shockwave therapy.   Preset: right heel  Power Level: 70 Frequency: 16 Impulse/cycles: 2700 Head size: large  Session: 4th  Patient did tolerate procedure well.  ECSWT Note Blake Rhodes 1966/02/11  Procedure: ECSWT Indications: left heel pain  Procedure Details Consent: Risks of procedure as well as the alternatives and risks of each were explained to the (patient/caregiver).  Consent for procedure obtained. Time Out: Verified patient identification, verified procedure, site/side was marked, verified correct patient position, special equipment/implants available, medications/allergies/relevent history reviewed, required imaging and test results available.  Performed.  The area was cleaned with iodine and alcohol swabs.    The left heel  was targeted for Extracorporeal shockwave therapy.  Preset: heel spur Power Level: 50 Frequency: 10 Impulse/cycles: 2300 Head size: large  Session: 3rd   Patient did tolerate procedure well.     ASSESSMENT & PLAN:   Heel spur, left Completed 3rd ECSWT  - f/u in one week.   Achilles tendon pain Completed 4th ECSWT  - f/u in 4 weeks.

## 2020-12-10 NOTE — Assessment & Plan Note (Signed)
Completed 3rd ECSWT  - f/u in one week.

## 2020-12-10 NOTE — Assessment & Plan Note (Signed)
Completed 4th ECSWT  - f/u in 4 weeks.

## 2020-12-16 ENCOUNTER — Ambulatory Visit: Payer: Self-pay | Admitting: Family Medicine

## 2020-12-16 ENCOUNTER — Encounter: Payer: Self-pay | Admitting: Family Medicine

## 2020-12-16 ENCOUNTER — Other Ambulatory Visit: Payer: Self-pay

## 2020-12-16 DIAGNOSIS — M7732 Calcaneal spur, left foot: Secondary | ICD-10-CM

## 2020-12-16 NOTE — Progress Notes (Signed)
  Blake Rhodes - 55 y.o. male MRN [id number]  Date of birth: 12-26-1965  SUBJECTIVE:  Including CC & ROS.  No chief complaint on file.   Blake Rhodes is a 55 y.o. male that is following up for fourth shockwave therapy on his left heel   Review of Systems See HPI   HISTORY: Past Medical, Surgical, Social, and Family History Reviewed & Updated per EMR.   Pertinent Historical Findings include:  Past Medical History:  Diagnosis Date  . Acute low back pain with sciatica   . Hyperlipidemia   . Sciatica bACK     Past Surgical History:  Procedure Laterality Date  . TONSILLECTOMY      Family History  Problem Relation Age of Onset  . Diabetes Mother   . Dementia Father   . High blood pressure Father   . Hypertension Father   . Mental illness Father   . Diabetes Maternal Grandfather   . Hypertension Paternal Grandmother   . Heart disease Paternal Grandfather     Social History   Socioeconomic History  . Marital status: Married    Spouse name: Not on file  . Number of children: Not on file  . Years of education: Not on file  . Highest education level: Not on file  Occupational History  . Not on file  Tobacco Use  . Smoking status: Never Smoker  . Smokeless tobacco: Never Used  Vaping Use  . Vaping Use: Never used  Substance and Sexual Activity  . Alcohol use: Yes    Alcohol/week: 2.0 standard drinks    Types: 2 Cans of beer per week    Comment: socially  . Drug use: No  . Sexual activity: Yes  Other Topics Concern  . Not on file  Social History Narrative  . Not on file   Social Determinants of Health   Financial Resource Strain: Not on file  Food Insecurity: Not on file  Transportation Needs: Not on file  Physical Activity: Not on file  Stress: Not on file  Social Connections: Not on file  Intimate Partner Violence: Not on file     PHYSICAL EXAM:  VS: Ht 5\' 10"  (1.778 m)   Wt 175 lb (79.4 kg)   BMI 25.11 kg/m  Physical Exam Gen: NAD, alert,  cooperative with exam, well-appearing   ECSWT Note Blake Rhodes 09/02/1965  Procedure: ECSWT Indications: left heel pain   Procedure Details Consent: Risks of procedure as well as the alternatives and risks of each were explained to the (patient/caregiver).  Consent for procedure obtained. Time Out: Verified patient identification, verified procedure, site/side was marked, verified correct patient position, special equipment/implants available, medications/allergies/relevent history reviewed, required imaging and test results available.  Performed.  The area was cleaned with iodine and alcohol swabs.    The left heel was targeted for Extracorporeal shockwave therapy.   Preset: heel spur Power Level: 60 Frequency: 2500 Impulse/cycles: 10 Head size: large  Session: 4th   Patient did tolerate procedure well.     ASSESSMENT & PLAN:   Heel spur, left Completed 4th shockwave today.  - f/u in 4 weeks.

## 2020-12-16 NOTE — Assessment & Plan Note (Signed)
Completed 4th shockwave today.  - f/u in 4 weeks.

## 2020-12-17 NOTE — Progress Notes (Signed)
[name] [address] [place] [address] Phone: [phone] Subjective:   I [name] am serving as a Neurosurgeon for Dr. Antoine Primas.  This visit occurred during the SARS-CoV-2 public health emergency.  Safety protocols were in place, including screening questions prior to the visit, additional usage of staff PPE, and extensive cleaning of exam room while observing appropriate contact time as indicated for disinfecting solutions.   I'm seeing this patient by the request  of:  Ardith Dark, MD  CC: Right heel pain follow-up  [id number]   11/04/2020 Patient does have more of a Achilles pain noted.  Discussed icing regimen and home exercises.  Patient continues to and fortunately have mild calcific findings noted.  There is seem to be a heel spur but seems to be significantly superficial and not part of the Achilles tendon.  We will see if patient could be a candidate for an acoustic calcific treatment options.  If patient is not due to the longevity of this pain I would consider the possibility of an MRI to further evaluate.  Patient will have a follow-up with me in 6 weeks and continue the same regimen at this time.  Prescription for topical anti-inflammatory given.  Update 12/18/2020 Kasey Ewings is a 55 y.o. male coming in with complaint of L heel pain. Has been receiving soundwave therapy from Dr. Jordan Likes for past month. Patient states the past 3 days he has a burning sensation in the heel. States the heel is improving overall. Swelling has gone down and he has been doing PT.       Past Medical History:  Diagnosis Date  . Acute low back pain with sciatica   . Hyperlipidemia   . Sciatica bACK    Past Surgical History:  Procedure Laterality Date  . TONSILLECTOMY     Social History   Socioeconomic History  . Marital status: Married    Spouse name: Not on file  . Number of children: Not on file  . Years of education: Not on file  .  Highest education level: Not on file  Occupational History  . Not on file  Tobacco Use  . Smoking status: Never Smoker  . Smokeless tobacco: Never Used  Vaping Use  . Vaping Use: Never used  Substance and Sexual Activity  . Alcohol use: Yes    Alcohol/week: 2.0 standard drinks    Types: 2 Cans of beer per week    Comment: socially  . Drug use: No  . Sexual activity: Yes  Other Topics Concern  . Not on file  Social History Narrative  . Not on file   Social Determinants of Health   Financial Resource Strain: Not on file  Food Insecurity: Not on file  Transportation Needs: Not on file  Physical Activity: Not on file  Stress: Not on file  Social Connections: Not on file   No Known Allergies Family History  Problem Relation Age of Onset  . Diabetes Mother   . Dementia Father   . High blood pressure Father   . Hypertension Father   . Mental illness Father   . Diabetes Maternal Grandfather   . Hypertension Paternal Grandmother   . Heart disease Paternal Grandfather     Current Outpatient Medications (Endocrine & Metabolic):  .  predniSONE (DELTASONE) 5 MG tablet, Take 6 pills for first day, 5 pills second day, 4 pills third day, 3 pills fourth day, 2 pills the fifth day, and 1  pill sixth day.  Current Outpatient Medications (Cardiovascular):  .  nitroGLYCERIN (NITRODUR - DOSED IN MG/24 HR) 0.2 mg/hr patch, Cut and apply 1/4 patch to the most painful area every 24 hours. (Patient taking differently: Cut and apply 1/4 patch to the most painful area every 24 hours.)     Current Outpatient Medications (Other):  Marland Kitchen  Diclofenac Sodium (PENNSAID) 2 % SOLN, Place 2 g onto the skin 2 (two) times daily.   Reviewed prior external information including notes and imaging from  primary care provider As well as notes that were available from care everywhere and other healthcare systems.  Past medical history, social, surgical and family history all reviewed in electronic medical  record.  No pertanent information unless stated regarding to the chief complaint.   Review of Systems:  No headache, visual changes, nausea, vomiting, diarrhea, constipation, dizziness, abdominal pain, skin rash, fevers, chills, night sweats, weight loss, swollen lymph nodes, body aches, joint swelling, chest pain, shortness of breath, mood changes. POSITIVE muscle aches  Objective  Blood pressure 120/82, pulse 73, height 5\' 10"  (1.778 m), weight 179 lb (81.2 kg), SpO2 97 %.   General: No apparent distress alert and oriented x3 mood and affect normal, dressed appropriately.  HEENT: Pupils equal, extraocular movements intact  Respiratory: Patient's speak in full sentences and does not appear short of breath  Cardiovascular: No lower extremity edema, non tender, no erythema  Gait normal with good balance and coordination.  MSK: The right heel exam shows the patient is very minimal tender to palpation at the insertion of the Achilles.  Still very mild tightness noted of the posterior cord but otherwise fairly unremarkable.  Limited musculoskeletal ultrasound was performed and interpreted by  Limited ultrasound of patient's right heel in the Achilles area shows significant calcific decrease in the tendon itself.  Patient has significant decrease in the hypoechoic changes that was noted in the fat pad, decrease in size of the large calcaneal heel spur noted. Impression: Interval improvement of heel spur decreased calcific changes of the Achilles and hypoechoic changes.  Once again this was very minimal from the very beginning though.   Impression and Recommendations:     The above documentation has been reviewed and is accurate and complete [name], DO

## 2020-12-18 ENCOUNTER — Encounter: Payer: Self-pay | Admitting: Family Medicine

## 2020-12-18 ENCOUNTER — Ambulatory Visit: Payer: Self-pay

## 2020-12-18 ENCOUNTER — Ambulatory Visit: Payer: 59 | Admitting: Family Medicine

## 2020-12-18 ENCOUNTER — Other Ambulatory Visit: Payer: Self-pay

## 2020-12-18 VITALS — BP 120/82 | HR 73 | Ht 70.0 in | Wt 179.0 lb

## 2020-12-18 DIAGNOSIS — G8929 Other chronic pain: Secondary | ICD-10-CM

## 2020-12-18 DIAGNOSIS — M79672 Pain in left foot: Secondary | ICD-10-CM | POA: Diagnosis not present

## 2020-12-18 DIAGNOSIS — M7732 Calcaneal spur, left foot: Secondary | ICD-10-CM

## 2020-12-18 NOTE — Patient Instructions (Addendum)
Good to see you Hoka arahi Continue the heel lift for now [facility] to start running but do 1 min job 1 min walk for up to 20 mins 3 times a week. Adding 1 min to the job each week until you are full 20 mins of running Continue exercises as cool down Ice when you need it  Check [employer] multivitamin See me again in 2 months just in case. If doing well cancel and see me when you need me

## 2020-12-18 NOTE — Assessment & Plan Note (Signed)
Patient has made some significant progress at this time.  Significant decrease in calcific changes noted of the Achilles with the shockwave therapy.  We discussed with patient about the nitroglycerin still as well as starting the walk run protocol.  Patient will start doing this.  Discussed different running shoes that I think will be beneficial.  Patient will follow up with me again in 2 months to make sure it completely resolved at this time.

## 2021-02-17 NOTE — Progress Notes (Deleted)
Blake Rhodes [address] [place] [address] Phone: [phone] Subjective:    I'm seeing this patient by the request  of:  Ardith Dark, MD  CC:   [id number]  12/18/2020 Patient has made some significant progress at this time.  Significant decrease in calcific changes noted of the Achilles with the shockwave therapy.  We discussed with patient about the nitroglycerin still as well as starting the walk run protocol.  Patient will start doing this.  Discussed different running shoes that I think will be beneficial.  Patient will follow up with me again in 2 months to make sure it completely resolved at this time.  Update  Blake Rhodes is a 55 y.o. male coming in with complaint of L heel pain. Patient states   Onset-  Location Duration-  Character- Aggravating factors- Reliving factors-  Therapies tried-  Severity-     Past Medical History:  Diagnosis Date   Acute low back pain with sciatica    Hyperlipidemia    Sciatica bACK    Past Surgical History:  Procedure Laterality Date   TONSILLECTOMY     Social History   Socioeconomic History   Marital status: Married    Spouse name: Not on file   Number of children: Not on file   Years of education: Not on file   Highest education level: Not on file  Occupational History   Not on file  Tobacco Use   Smoking status: Never   Smokeless tobacco: Never  Vaping Use   Vaping Use: Never used  Substance and Sexual Activity   Alcohol use: Yes    Alcohol/week: 2.0 standard drinks    Types: 2 Cans of beer per week    Comment: socially   Drug use: No   Sexual activity: Yes  Other Topics Concern   Not on file  Social History Narrative   Not on file   Social Determinants of Health   Financial Resource Strain: Not on file  Food Insecurity: Not on file  Transportation Needs: Not on file  Physical Activity: Not on file  Stress: Not on file  Social Connections: Not on file   No  Known Allergies Family History  Problem Relation Age of Onset   Diabetes Mother    Dementia Father    High blood pressure Father    Hypertension Father    Mental illness Father    Diabetes Maternal Grandfather    Hypertension Paternal Grandmother    Heart disease Paternal Grandfather     Current Outpatient Medications (Endocrine & Metabolic):    predniSONE (DELTASONE) 5 MG tablet, Take 6 pills for first day, 5 pills second day, 4 pills third day, 3 pills fourth day, 2 pills the fifth day, and 1 pill sixth day.  Current Outpatient Medications (Cardiovascular):    nitroGLYCERIN (NITRODUR - DOSED IN MG/24 HR) 0.2 mg/hr patch, Cut and apply 1/4 patch to the most painful area every 24 hours. (Patient taking differently: Cut and apply 1/4 patch to the most painful area every 24 hours.)     Current Outpatient Medications (Other):    Diclofenac Sodium (PENNSAID) 2 % SOLN, Place 2 g onto the skin 2 (two) times daily.   Reviewed prior external information including notes and imaging from  primary care provider As well as notes that were available from care everywhere and other healthcare systems.  Past medical history, social, surgical and family history all reviewed in electronic medical record.  No  pertanent information unless stated regarding to the chief complaint.   Review of Systems:  No headache, visual changes, nausea, vomiting, diarrhea, constipation, dizziness, abdominal pain, skin rash, fevers, chills, night sweats, weight loss, swollen lymph nodes, body aches, joint swelling, chest pain, shortness of breath, mood changes. POSITIVE muscle aches  Objective  There were no vitals taken for this visit.   General: No apparent distress alert and oriented x3 mood and affect normal, dressed appropriately.  HEENT: Pupils equal, extraocular movements intact  Respiratory: Patient's speak in full sentences and does not appear short of breath  Cardiovascular: No lower extremity edema,  non tender, no erythema  Gait normal with good balance and coordination.  MSK:  Non tender with full range of motion and good stability and symmetric strength and tone of shoulders, elbows, wrist, hip, knee and ankles bilaterally.     Impression and Recommendations:     The above documentation has been reviewed and is accurate and complete Wilford Grist

## 2021-02-18 ENCOUNTER — Ambulatory Visit: Payer: 59 | Admitting: Family Medicine

## 2021-03-31 ENCOUNTER — Encounter: Payer: Self-pay | Admitting: Family Medicine

## 2021-04-02 MED ORDER — TRAZODONE HCL 50 MG PO TABS: 50.0000 mg | ORAL_TABLET | Freq: Every day | ORAL | 0 refills | Status: DC

## 2021-04-06 ENCOUNTER — Telehealth: Payer: 59 | Admitting: Family Medicine

## 2021-04-06 NOTE — Progress Notes (Signed)
Patient was scheduled for virtual visit.  We attempted to call having 3 separate times at the time of his appointment but were not able to get in touch with patient.  He was not seen or evaluated today.

## 2021-04-26 ENCOUNTER — Other Ambulatory Visit: Payer: Self-pay

## 2021-04-26 ENCOUNTER — Encounter: Payer: Self-pay | Admitting: Family Medicine

## 2021-04-26 ENCOUNTER — Ambulatory Visit: Payer: 59 | Admitting: Family Medicine

## 2021-04-26 VITALS — BP 131/79 | HR 75 | Temp 98.3°F | Ht 70.0 in | Wt 179.4 lb

## 2021-04-26 DIAGNOSIS — Z125 Encounter for screening for malignant neoplasm of prostate: Secondary | ICD-10-CM

## 2021-04-26 DIAGNOSIS — E785 Hyperlipidemia, unspecified: Secondary | ICD-10-CM

## 2021-04-26 DIAGNOSIS — G4700 Insomnia, unspecified: Secondary | ICD-10-CM

## 2021-04-26 DIAGNOSIS — Z1159 Encounter for screening for other viral diseases: Secondary | ICD-10-CM

## 2021-04-26 DIAGNOSIS — R739 Hyperglycemia, unspecified: Secondary | ICD-10-CM

## 2021-04-26 DIAGNOSIS — G4730 Sleep apnea, unspecified: Secondary | ICD-10-CM

## 2021-04-26 DIAGNOSIS — Z0001 Encounter for general adult medical examination with abnormal findings: Secondary | ICD-10-CM | POA: Diagnosis not present

## 2021-04-26 DIAGNOSIS — Z1211 Encounter for screening for malignant neoplasm of colon: Secondary | ICD-10-CM | POA: Diagnosis not present

## 2021-04-26 DIAGNOSIS — Z6825 Body mass index (BMI) 25.0-25.9, adult: Secondary | ICD-10-CM

## 2021-04-26 NOTE — Assessment & Plan Note (Signed)
Doing well with melatonin at night.

## 2021-04-26 NOTE — Patient Instructions (Signed)
It was very nice to see you today!  We will check blood work.  I will refer you to see Dr. Vickey Huger.   We will order your Cologuard.  I will see you back in a year.  Come back to see me sooner if needed.  Take care, Dr Jimmey Ralph  PLEASE NOTE:  If you had any lab tests please let us know if you have not heard back within a few days. You may see your results on mychart before we have a chance to review them but we will give you a call once they are reviewed by [place]. If we ordered any referrals today, please let us know if you have not heard from their office within the next week.   Please try these tips to maintain a healthy lifestyle:  Eat at least 3 REAL meals and 1-2 snacks per day.  Aim for no more than 5 hours between eating.  If you eat breakfast, please do so within one hour of getting up.   Each meal should contain half fruits/vegetables, one quarter protein, and one quarter carbs (no bigger than a computer mouse)  Cut down on sweet beverages. This includes juice, soda, and sweet tea.   Drink at least 1 glass of water with each meal and aim for at least 8 glasses per day  Exercise at least 150 minutes every week.    Preventive Care 55 Years Old, Male Preventive care refers to lifestyle choices and visits with your health care provider that can promote health and wellness. This includes: A yearly physical exam. This is also called an annual wellness visit. Regular dental and eye exams. Immunizations. Screening for certain conditions. Healthy lifestyle choices, such as: Eating a healthy diet. Getting regular exercise. Not using drugs or products that contain nicotine and tobacco. Limiting alcohol use. What can I expect for my preventive care visit? Physical exam Your health care provider will check your: Height and weight. These may be used to calculate your BMI (body mass index). BMI is a measurement that tells if you are at a healthy weight. Heart rate and blood  pressure. Body temperature. Skin for abnormal spots. Counseling Your health care provider may ask you questions about your: Past medical problems. Family's medical history. Alcohol, tobacco, and drug use. Emotional well-being. Home life and relationship well-being. Sexual activity. Diet, exercise, and sleep habits. Work and work Astronomer. Access to firearms. What immunizations do I need? Vaccines are usually given at various ages, according to a schedule. Your health care provider will recommend vaccines for you based on your age, medical history, and lifestyle or other factors, such as travel or where you work. What tests do I need? Blood tests Lipid and cholesterol levels. These may be checked every 5 years, or more often if you are over 13 years old. Hepatitis C test. Hepatitis B test. Screening Lung cancer screening. You may have this screening every year starting at age 72 if you have a 30-pack-year history of smoking and currently smoke or have quit within the past 15 years. Prostate cancer screening. Recommendations will vary depending on your family history and other risks. Genital exam to check for testicular cancer or hernias. Colorectal cancer screening. All adults should have this screening starting at age 110 and continuing until age 19. Your health care provider may recommend screening at age 23 if you are at increased risk. You will have tests every 1-10 years, depending on your results and the type of screening test. Diabetes  screening. This is done by checking your blood sugar (glucose) after you have not eaten for a while (fasting). You may have this done every 1-3 years. STD (sexually transmitted disease) testing, if you are at risk. Follow these instructions at home: Eating and drinking  Eat a diet that includes fresh fruits and vegetables, whole grains, lean protein, and low-fat dairy products. Take vitamin and mineral supplements as recommended by your  health care provider. Do not drink alcohol if your health care provider tells you not to drink. If you drink alcohol: Limit how much you have to 0-2 drinks a day. Be aware of how much alcohol is in your drink. In the [place] one drink equals one 12 oz bottle of beer (355 mL), one 5 oz glass of wine (148 mL), or one 1 oz glass of hard liquor (44 mL). Lifestyle Take daily care of your teeth and gums. Brush your teeth every morning and night with fluoride toothpaste. Floss one time each day. Stay active. Exercise for at least 30 minutes 5 or more days each week. Do not use any products that contain nicotine or tobacco, such as cigarettes, e-cigarettes, and chewing tobacco. If you need help quitting, ask your health care provider. Do not use drugs. If you are sexually active, practice safe sex. Use a condom or other form of protection to prevent STIs (sexually transmitted infections). If told by your health care provider, take low-dose aspirin daily starting at age 75. Find healthy ways to cope with stress, such as: Meditation, yoga, or listening to music. Journaling. Talking to a trusted person. Spending time with friends and family. Safety Always wear your seat belt while driving or riding in a vehicle. Do not drive: If you have been drinking alcohol. Do not ride with someone who has been drinking. When you are tired or distracted. While texting. Wear a helmet and other protective equipment during sports activities. If you have firearms in your house, make sure you follow all gun safety procedures. What's next? Go to your health care provider once a year for an annual wellness visit. Ask your health care provider how often you should have your eyes and teeth checked. Stay up to date on all vaccines. This information is not intended to replace advice given to you by your health care provider. Make sure you discuss any questions you have with your health care provider. Document Revised:  09/25/2020 Document Reviewed: 07/12/2018 Elsevier Patient Education  2022 [employer].

## 2021-04-26 NOTE — Assessment & Plan Note (Signed)
Check lipids 

## 2021-04-26 NOTE — Assessment & Plan Note (Signed)
Will place referral to sleep medicine for further evaluation.  He is concerned for sleep apnea.

## 2021-04-26 NOTE — Progress Notes (Signed)
Chief Complaint:  Blake Rhodes is a 55 y.o. male who presents today for his annual comprehensive physical exam.    Assessment/Plan:  Chronic Problems Addressed Today: Sleep-disordered breathing Will place referral to sleep medicine for further evaluation.  He is concerned for sleep apnea.  Insomnia Doing well with melatonin at night.  Dyslipidemia Check lipids.   Body mass index is 25.74 kg/m. / Overweight  BMI Metric Follow Up - 04/26/21 1505       BMI Metric Follow Up-Please document annually   BMI Metric Follow Up Education provided              Preventative Healthcare: Check Labs. Will be getting flu vaccine at work.   Patient Counseling(The following topics were reviewed and/or handout was given):  -Nutrition: Stressed importance of moderation in sodium/caffeine intake, saturated fat and cholesterol, caloric balance, sufficient intake of fresh fruits, vegetables, and fiber.  -Stressed the importance of regular exercise.   -Substance Abuse: Discussed cessation/primary prevention of tobacco, alcohol, or other drug use; driving or other dangerous activities under the influence; availability of treatment for abuse.   -Injury prevention: Discussed safety belts, safety helmets, smoke detector, smoking near bedding or upholstery.   -Sexuality: Discussed sexually transmitted diseases, partner selection, use of condoms, avoidance of unintended pregnancy and contraceptive alternatives.   -Dental health: Discussed importance of regular tooth brushing, flossing, and dental visits.  -Health maintenance and immunizations reviewed. Please refer to Health maintenance section.  Return to care in 1 year for next preventative visit.     Subjective:  HPI:  He has no acute complaints today .  About 2-3 weeks ago, he was having significant sleep problems. Some nights he would not sleep at all. However after being prescribed melatonin the issue was greatly alleviated, in conjunction  with reducing electronic usage/screentime at night. He admits to being concerned about sleep apnea.  Lifestyle Diet: Cutting down on snacks, and is going to attempt fasting to reduce triglycerides.  Exercise:Works out 30 mins daily, does biking and ellipticals, lifts weights, runs and walks, etc.   Depression screen [facility] 2/9 04/26/2021  Decreased Interest 0  Down, Depressed, Hopeless 0  PHQ - 2 Score 0  Altered sleeping -  Tired, decreased energy -  Change in appetite -  Feeling bad or failure about yourself  -  Trouble concentrating -  Moving slowly or fidgety/restless -  Suicidal thoughts -  PHQ-9 Score -  Difficult doing work/chores -    Health Maintenance Due  Topic Date Due   HIV Screening  Never done   Hepatitis C Screening  Never done   Fecal DNA (Cologuard)  Never done   Zoster Vaccines- Shingrix (1 of 2) Never done   COVID-19 Vaccine (4 - Booster for Pfizer series) 10/11/2020     ROS: Per HPI, otherwise a complete review of systems was negative.   PMH:  The following were reviewed and entered/updated in epic: Past Medical History:  Diagnosis Date   Acute low back pain with sciatica    Hyperlipidemia    Sciatica bACK    Patient Active Problem List   Diagnosis Date Noted   Insomnia 04/26/2021   Sleep-disordered breathing 04/26/2021   Heel spur, left 11/12/2020   Achilles tendon pain 09/24/2020   Dyslipidemia 03/26/2019   Low back pain 03/25/2019   Past Surgical History:  Procedure Laterality Date   TONSILLECTOMY      Family History  Problem Relation Age of Onset   Diabetes Mother  Dementia Father    High blood pressure Father    Hypertension Father    Mental illness Father    Diabetes Maternal Grandfather    Hypertension Paternal Grandmother    Heart disease Paternal Grandfather     Medications- reviewed and updated Current Outpatient Medications  Medication Sig Dispense Refill   traZODone (DESYREL) 50 MG tablet Take 1 tablet (50 mg total)  by mouth at bedtime. 30 tablet 0   No current facility-administered medications for this visit.    Allergies-reviewed and updated No Known Allergies  Social History   Socioeconomic History   Marital status: Married    Spouse name: Not on file   Number of children: Not on file   Years of education: Not on file   Highest education level: Not on file  Occupational History   Not on file  Tobacco Use   Smoking status: Never   Smokeless tobacco: Never  Vaping Use   Vaping Use: Never used  Substance and Sexual Activity   Alcohol use: Yes    Alcohol/week: 2.0 standard drinks    Types: 2 Cans of beer per week    Comment: socially   Drug use: No   Sexual activity: Yes  Other Topics Concern   Not on file  Social History Narrative   Not on file   Social Determinants of Health   Financial Resource Strain: Not on file  Food Insecurity: Not on file  Transportation Needs: Not on file  Physical Activity: Not on file  Stress: Not on file  Social Connections: Not on file        Objective:  Physical Exam: BP 131/79   Pulse 75   Temp 98.3 F (36.8 C) (Temporal)   Ht 5\' 10"  (1.778 m)   Wt 179 lb 6.4 oz (81.4 kg)   SpO2 97%   BMI 25.74 kg/m   Body mass index is 25.74 kg/m. Wt Readings from Last 3 Encounters:  04/26/21 179 lb 6.4 oz (81.4 kg)  12/18/20 179 lb (81.2 kg)  12/16/20 175 lb (79.4 kg)   Gen: NAD, resting comfortably HEENT: TMs normal bilaterally. OP clear. No thyromegaly noted.  CV: RRR with no murmurs appreciated Pulm: NWOB, CTAB with no crackles, wheezes, or rhonchi GI: Normal bowel sounds present. Soft, Nontender, Nondistended. MSK: no edema, cyanosis, or clubbing noted Skin: warm, dry Neuro: CN2-12 grossly intact. Strength 5/5 in upper and lower extremities. Reflexes symmetric and intact bilaterally.  Psych: Normal affect and thought content     I,Jordan Kelly,acting as a scribe for 12/18/20, MD.,have documented all relevant documentation on the  behalf of Jacquiline Doe, MD,as directed by  Jacquiline Doe, MD while in the presence of Jacquiline Doe, MD.  I, Jacquiline Doe, MD, have reviewed all documentation for this visit. The documentation on 04/26/21 for the exam, diagnosis, procedures, and orders are all accurate and complete.  04/28/21. Katina Degree, MD 04/26/2021 3:05 PM

## 2021-04-27 LAB — CBC
HCT: 44.2 % (ref 39.0–52.0)
Hemoglobin: 14.8 g/dL (ref 13.0–17.0)
MCHC: 33.5 g/dL (ref 30.0–36.0)
MCV: 84 fl (ref 78.0–100.0)
Platelets: 151 10*3/uL (ref 150.0–400.0)
RBC: 5.26 Mil/uL (ref 4.22–5.81)
RDW: 13 % (ref 11.5–15.5)
WBC: 5.2 10*3/uL (ref 4.0–10.5)

## 2021-04-27 LAB — HEPATITIS C ANTIBODY
Hepatitis C Ab: NONREACTIVE
SIGNAL TO CUT-OFF: 0.01 (ref <1.00)

## 2021-04-27 LAB — COMPREHENSIVE METABOLIC PANEL
ALT: 32 U/L (ref 0–53)
AST: 35 U/L (ref 0–37)
Albumin: 4.5 g/dL (ref 3.5–5.2)
Alkaline Phosphatase: 72 U/L (ref 39–117)
BUN: 10 mg/dL (ref 6–23)
CO2: 27 mEq/L (ref 19–32)
Calcium: 10 mg/dL (ref 8.4–10.5)
Chloride: 101 mEq/L (ref 96–112)
Creatinine, Ser: 1.01 mg/dL (ref 0.40–1.50)
GFR: 84.86 mL/min (ref >60.00)
Glucose, Bld: 108 mg/dL — ABNORMAL HIGH (ref 70–99)
Potassium: 4.2 mEq/L (ref 3.5–5.1)
Sodium: 137 mEq/L (ref 135–145)
Total Bilirubin: 0.5 mg/dL (ref 0.2–1.2)
Total Protein: 6.8 g/dL (ref 6.0–8.3)

## 2021-04-27 LAB — PSA: PSA: 0.31 ng/mL (ref 0.10–4.00)

## 2021-04-27 LAB — LIPID PANEL
Cholesterol: 169 mg/dL (ref 0–200)
HDL: 30.9 mg/dL — ABNORMAL LOW (ref >39.00)
Total CHOL/HDL Ratio: 5
Triglycerides: 412 mg/dL — ABNORMAL HIGH (ref 0.0–149.0)

## 2021-04-27 LAB — HEPATITIS B SURFACE ANTIBODY,QUALITATIVE: Hep B S Ab: NONREACTIVE

## 2021-04-27 LAB — HEPATITIS B SURFACE ANTIGEN: Hepatitis B Surface Ag: NONREACTIVE

## 2021-04-27 LAB — HEMOGLOBIN A1C: Hgb A1c MFr Bld: 5.9 % (ref 4.6–6.5)

## 2021-04-27 LAB — LDL CHOLESTEROL, DIRECT: Direct LDL: 99 mg/dL

## 2021-04-27 LAB — TSH: TSH: 1.18 u[IU]/mL (ref 0.35–5.50)

## 2021-04-28 ENCOUNTER — Telehealth: Payer: Self-pay

## 2021-04-28 NOTE — Telephone Encounter (Signed)
Pt returned a call about labs. Please Advise.  

## 2021-04-28 NOTE — Progress Notes (Signed)
Please inform patient of the following:  His triglycerides were a little high. His blood sugar was also borderline. Do not need to start meds but he should continue working on diet and exercise and we can recheck in a year.  He needs the hepatitis B vaccine series. HE can come here to have this done if he wishes.   Katina Degree. Jimmey Ralph, MD 04/28/2021 8:01 AM

## 2021-05-25 ENCOUNTER — Ambulatory Visit: Payer: 59 | Admitting: Family Medicine

## 2021-05-25 DIAGNOSIS — Z299 Encounter for prophylactic measures, unspecified: Secondary | ICD-10-CM | POA: Diagnosis not present

## 2021-05-25 DIAGNOSIS — J040 Acute laryngitis: Secondary | ICD-10-CM | POA: Diagnosis not present

## 2021-05-25 DIAGNOSIS — Z6824 Body mass index (BMI) 24.0-24.9, adult: Secondary | ICD-10-CM | POA: Diagnosis not present

## 2021-05-26 ENCOUNTER — Ambulatory Visit: Payer: 59 | Admitting: Family Medicine

## 2021-05-31 ENCOUNTER — Encounter: Payer: Self-pay | Admitting: Family Medicine

## 2021-06-01 ENCOUNTER — Encounter: Payer: Self-pay | Admitting: Family Medicine

## 2021-06-21 ENCOUNTER — Ambulatory Visit (HOSPITAL_BASED_OUTPATIENT_CLINIC_OR_DEPARTMENT_OTHER): Payer: 59 | Attending: Family Medicine | Admitting: Physical Therapy

## 2021-06-21 ENCOUNTER — Other Ambulatory Visit: Payer: Self-pay

## 2021-06-21 ENCOUNTER — Ambulatory Visit: Payer: Self-pay

## 2021-06-21 ENCOUNTER — Ambulatory Visit (INDEPENDENT_AMBULATORY_CARE_PROVIDER_SITE_OTHER): Payer: 59 | Admitting: Family Medicine

## 2021-06-21 ENCOUNTER — Ambulatory Visit (HOSPITAL_BASED_OUTPATIENT_CLINIC_OR_DEPARTMENT_OTHER): Payer: 59 | Admitting: Physical Therapy

## 2021-06-21 ENCOUNTER — Other Ambulatory Visit (HOSPITAL_BASED_OUTPATIENT_CLINIC_OR_DEPARTMENT_OTHER): Payer: Self-pay

## 2021-06-21 ENCOUNTER — Encounter (HOSPITAL_BASED_OUTPATIENT_CLINIC_OR_DEPARTMENT_OTHER): Payer: Self-pay | Admitting: Physical Therapy

## 2021-06-21 VITALS — BP 130/80 | Ht 70.0 in | Wt 174.0 lb

## 2021-06-21 DIAGNOSIS — M25572 Pain in left ankle and joints of left foot: Secondary | ICD-10-CM

## 2021-06-21 DIAGNOSIS — M6528 Calcific tendinitis, other site: Secondary | ICD-10-CM

## 2021-06-21 DIAGNOSIS — R262 Difficulty in walking, not elsewhere classified: Secondary | ICD-10-CM | POA: Diagnosis not present

## 2021-06-21 DIAGNOSIS — M6281 Muscle weakness (generalized): Secondary | ICD-10-CM | POA: Diagnosis not present

## 2021-06-21 MED ORDER — TRIAMCINOLONE ACETONIDE 0.1 % EX OINT: 1.0000 "application " | TOPICAL_OINTMENT | Freq: Two times a day (BID) | CUTANEOUS | 0 refills | Status: DC

## 2021-06-21 MED ORDER — PREDNISONE 20 MG PO TABS
ORAL_TABLET | ORAL | 0 refills | Status: DC
  Filled 2021-06-21: qty 18, 10d supply, fill #0

## 2021-06-21 NOTE — Therapy (Signed)
OUTPATIENT PHYSICAL THERAPY LOWER EXTREMITY EVALUATION   Patient Name: Blake Rhodes MRN: [id number] DOB:May 16, 1966, 55 y.o., male Today's Date: 06/21/2021   PT End of Session - 06/21/21 1238     Visit Number 1    Number of Visits 12    Date for PT Re-Evaluation 09/19/21    Authorization Type [facility]    PT Start Time 1115   Pt arrives late   PT Stop Time 1145    PT Time Calculation (min) 30 min    Activity Tolerance Patient limited by pain    Behavior During Therapy [facility] for tasks assessed/performed             Past Medical History:  Diagnosis Date   Acute low back pain with sciatica    Hyperlipidemia    Sciatica bACK    Past Surgical History:  Procedure Laterality Date   TONSILLECTOMY     Patient Active Problem List   Diagnosis Date Noted   Insomnia 04/26/2021   Sleep-disordered breathing 04/26/2021   Calcific Achilles tendinitis 11/12/2020   Achilles tendon pain 09/24/2020   Dyslipidemia 03/26/2019   Low back pain 03/25/2019    PCP: Ardith Dark, MD  REFERRING PROVIDER: Myra Rude, MD  REFERRING DIAG: [phone] (ICD-10-CM) - Calcific Achilles tendinitis  THERAPY DIAG:  Pain in left ankle and joints of left foot - Plan: PT plan of care cert/re-cert  Difficulty in walking, not elsewhere classified - Plan: PT plan of care cert/re-cert  Muscle weakness (generalized) - Plan: PT plan of care cert/re-cert  ONSET DATE: 06/2021  SUBJECTIVE:   SUBJECTIVE STATEMENT: Pt states that the L side is worse recently. He states that traditionally [place] and ionto has been helpful. On Friday, he was wearing dress shoes all day for work and flared up the pain. Pt states he has tried HEP recently and that does help. Pt does not perform HEP after the pain has stopped. The stretching improves the pain. Afterward, it will be normal. He has been wearing the boot to sleep due to pain. He does not use a lift in his dress shoes. He feels no pain when wearing his [place].    PERTINENT HISTORY: Chronic Achilles tendinitis   PAIN:  Are you having pain? Yes VAS scale: 1/10 Pain location: L achilles and insertion  Pain orientation: Left  PAIN TYPE: sharp Pain description: intermittent  Aggravating factors: walking  Relieving factors: ionto, stretching, [place]  PRECAUTIONS: None  WEIGHT BEARING RESTRICTIONS No  FALLS:  Has patient fallen in last 6 months? No  LIVING ENVIRONMENT: Lives with: lives with their family  OCCUPATION: [employer]  PLOF: Independent  PATIENT GOALS : Pt states he would like to walk normally and get back to running/tennis.    OBJECTIVE: Pt presents to evaluation with CAM boot and heel lift in place on L LE.   PATIENT SURVEYS:  Unable to take today due to time. Plan for LEFS at next session  COGNITION:  Overall cognitive status: Within functional limits for tasks assessed     SENSATION:  Light touch: Appears intact   MUSCLE LENGTH: L gastroc limited by 20% as compared to R ankle ROM  POSTURE:  WFL  LE AROM/PROM:  A/PROM Right 06/21/2021 Left 06/21/2021  Ankle dorsiflexion WFL 3  Ankle plantarflexion WFL 35  Ankle inversion [facility]  Ankle eversion WFL WFL   (Blank rows = not tested)  LE MMT:  MMT Right 06/21/2021 Left 06/21/2021  Ankle dorsiflexion [facility]  Ankle plantarflexion WFL 4-/5 (pain with SL HR)   (Blank rows = not tested)  LOWER EXTREMITY SPECIAL TESTS:  Ankle special tests: Thompson's test: negative  JOINT MOBILITY ASSESSMENT:  DF stiffness of L TCJ TTP of L achilles insertion and distal tendon body Hypertonicity of L triceps surae medial and lateral regions  FUNCTIONAL TESTS:  STS: UE support required, uneven WB on L LE  GAIT: Distance walked: 31ft Assistive device utilized:  N/A Level of assistance: Complete Independence Comments: antalgic     TODAY'S TREATMENT:  Manual therapy: STM to L triceps surae [place] 3.0 MHz, 1.5 W/cm^2, 5cm [place] head; 8 min Iontophoresis 4 hour  patch- applied distally along L achilles region of insertion Review of previous HEP   PATIENT EDUCATION:  Education details: MOI, diagnosis, prognosis, anatomy, exercise progression, DOMS expectations, muscle firing,  envelope of function, HEP, POC  Person educated: Patient Education method: Explanation and Demonstration Education comprehension: verbalized understanding, verbal cues required, tactile cues required, and needs further education   HOME EXERCISE PROGRAM: To be updated at next session;   ASSESSMENT:  CLINICAL IMPRESSION: Patient is a 55 y.o. male who was seen today for physical therapy evaluation and treatment for cc of L heel pain. Pt's s/s appear consistent with L achilles tendinitis and heel spurring. Pt's L ankle DF ROM is roughly 75-80% of R. Pt's pain appears to be mechanical in nature due to ROM restriction. Objective impairments include Abnormal gait, decreased activity tolerance, decreased knowledge of condition, decreased mobility, difficulty walking, decreased ROM, decreased strength, hypomobility, increased muscle spasms, impaired flexibility, improper body mechanics, postural dysfunction, and pain. These impairments are limiting patient from community activity, occupation, shopping, and exercise/recreation . Personal factors including Age, Behavior pattern, Profession, and Time since onset of injury/illness/exacerbation are also affecting patient's functional outcome. Patient will benefit from skilled PT to address above impairments and improve overall function.  REHAB POTENTIAL: Fair - HEP compliance limiting pt outcomes  CLINICAL DECISION MAKING: Stable/uncomplicated  EVALUATION COMPLEXITY: Low   GOALS:   SHORT TERM GOALS:  STG Name Target Date Goal status  1 Pt will become independent with HEP in order to demonstrate synthesis of PT education.  Baseline:  07/05/2021 INITIAL  2 Pt will report at least 2 pt reduction on NPRS scale for pain in order to  demonstrate functional improvement with household activity, self care, and ADL.  Baseline:  07/12/2021 INITIAL  3 Pt will be able to demonstrate full SL HR without pain on L in order to demonstrate functional improvement in LE function for self-care and house hold duties.   07/12/2021 INITIAL   LONG TERM GOALS:  To be set after re-assessment of STG   PLAN: PT FREQUENCY: 1-2x/week  PT DURATION: 6 weeks  PLANNED INTERVENTIONS: Therapeutic exercises, Therapeutic activity, Neuro Muscular re-education, Balance training, Gait training, Patient/Family education, Joint mobilization, Stair training, Orthotic/Fit training, Aquatic Therapy, Dry Needling, Electrical stimulation, Cryotherapy, Moist heat, scar mobilization, Taping, Vasopneumatic device, Traction, Ultrasound, Ionotophoresis 4mg /ml Dexamethasone, and Manual therapy  PLAN FOR NEXT SESSION: STM  calf, R ankle TCJ post glide, DL eccentric heel raise on step, gastroc stretch at wall with towel arch prop, heel toe rocking, Pt requests new ionto patch, DN/manual therapy, and at next session.    [place] PT, [name] 06/21/21 1:01 PM

## 2021-06-21 NOTE — Therapy (Deleted)
OUTPATIENT PHYSICAL THERAPY LOWER EXTREMITY EVALUATION   Patient Name: Blake Rhodes MRN: [id number] DOB:1965-11-07, 55 y.o., male Today's Date: 06/21/2021    Past Medical History:  Diagnosis Date   Acute low back pain with sciatica    Hyperlipidemia    Sciatica bACK    Past Surgical History:  Procedure Laterality Date   TONSILLECTOMY     Patient Active Problem List   Diagnosis Date Noted   Insomnia 04/26/2021   Sleep-disordered breathing 04/26/2021   Calcific Achilles tendinitis 11/12/2020   Achilles tendon pain 09/24/2020   Dyslipidemia 03/26/2019   Low back pain 03/25/2019    PCP: Ardith Dark, MD  REFERRING PROVIDER: Myra Rude, MD  REFERRING DIAG: ***  THERAPY DIAG:  No diagnosis found.  ONSET DATE: ***  SUBJECTIVE:   SUBJECTIVE STATEMENT: ***  PERTINENT HISTORY: ***  PAIN:  Are you having pain? {yes/no:20286} VAS scale: ***/10 Pain location: *** Pain orientation: {Pain Orientation:25161}  PAIN TYPE: {type:313116} Pain description: {PAIN DESCRIPTION:21022940}  Aggravating factors: *** Relieving factors: ***  PRECAUTIONS: {Therapy precautions:24002}  WEIGHT BEARING RESTRICTIONS {Yes ***/No:24003}  FALLS:  Has patient fallen in last 6 months? {yes/no:20286}, Number of falls: ***  LIVING ENVIRONMENT: Lives with: {[facility] lives with:25569::"lives with their family"} Lives in: {Lives in:25570} Stairs: {yes/no:20286}; {Stairs:24000} Has following equipment at home: {Assistive devices:23999}  OCCUPATION: ***  PLOF: {PLOF:24004}  PATIENT GOALS ***   OBJECTIVE:   DIAGNOSTIC FINDINGS: ***  PATIENT SURVEYS:  {rehab surveys:24030}  COGNITION:  Overall cognitive status: {cognition:24006}     SENSATION:  Light touch: {intact/deficits:24005}  Stereognosis: {intact/deficits:24005}  Hot/Cold: {intact/deficits:24005}  Proprioception: {intact/deficits:24005}  MUSCLE LENGTH: Hamstrings: Right *** deg; Left *** deg Thomas test:  Right *** deg; Left *** deg  POSTURE:  ***  LE AROM/PROM:  A/PROM Right 06/21/2021 Left 06/21/2021  Hip flexion    Hip extension    Hip abduction    Hip adduction    Hip internal rotation    Hip external rotation    Knee flexion    Knee extension    Ankle dorsiflexion    Ankle plantarflexion    Ankle inversion    Ankle eversion     (Blank rows = not tested)  LE MMT:  MMT Right 06/21/2021 Left 06/21/2021  Hip flexion    Hip extension    Hip abduction    Hip adduction    Hip internal rotation    Hip external rotation    Knee flexion    Knee extension    Ankle dorsiflexion    Ankle plantarflexion    Ankle inversion    Ankle eversion     (Blank rows = not tested)  LOWER EXTREMITY SPECIAL TESTS:  {LEspecialtests:26242}  JOINT MOBILITY ASSESSMENT:  ***  FUNCTIONAL TESTS:  {Functional tests:24029}  GAIT: Distance walked: *** Assistive device utilized: {Assistive devices:23999} Level of assistance: {Levels of assistance:24026} Comments: ***    TODAY'S TREATMENT: ***   PATIENT EDUCATION:  Education details: *** Person educated: {Person educated:25204} Education method: {Education Method:25205} Education comprehension: {Education Comprehension:25206}   HOME EXERCISE PROGRAM: ***  ASSESSMENT:  CLINICAL IMPRESSION: Patient is a *** y.o. *** who was seen today for physical therapy evaluation and treatment for ***. Objective impairments include {opptimpairments:25111}. These impairments are limiting patient from {activity limitations:25113}. Personal factors including {Personal factors:25162} are also affecting patient's functional outcome. Patient will benefit from skilled PT to address above impairments and improve overall function.  REHAB POTENTIAL: {rehabpotential:25112}  CLINICAL DECISION MAKING: {clinical decision making:25114}  EVALUATION COMPLEXITY: {Evaluation  complexity:25115}   GOALS: Goals reviewed with patient?  {yes/no:20286}  SHORT TERM GOALS:  STG Name Target Date Goal status  1 *** Baseline:  {follow up:25551} {GOALSTATUS:25110}  2 *** Baseline:  {follow up:25551} {GOALSTATUS:25110}  3 *** Baseline: {follow up:25551} {GOALSTATUS:25110}  4 *** Baseline: {follow up:25551} {GOALSTATUS:25110}  5 *** Baseline: {follow up:25551} {GOALSTATUS:25110}  6 *** Baseline: {follow up:25551} {GOALSTATUS:25110}  7 *** Baseline: {follow up:25551} {GOALSTATUS:25110}   LONG TERM GOALS:   LTG Name Target Date Goal status  1 *** Baseline: {follow up:25551} {GOALSTATUS:25110}  2 *** Baseline: {follow up:25551} {GOALSTATUS:25110}  3 *** Baseline: {follow up:25551} {GOALSTATUS:25110}  4 *** Baseline: {follow up:25551} {GOALSTATUS:25110}  5 *** Baseline: {follow up:25551} {GOALSTATUS:25110}  6 *** Baseline: {follow up:25551} {GOALSTATUS:25110}  7 *** Baseline: {follow up:25551} {GOALSTATUS:25110}   PLAN: PT FREQUENCY: {rehab frequency:25116}  PT DURATION: {rehab duration:25117}  PLANNED INTERVENTIONS: {rehab planned interventions:25118::"Therapeutic exercises","Therapeutic activity","Neuro Muscular re-education","Balance training","Gait training","Patient/Family education","Joint mobilization"}  PLAN FOR NEXT SESSION: Zebedee Iba 06/21/2021, 12:38 PM

## 2021-06-21 NOTE — Patient Instructions (Signed)
Good to see you I will call with the lab results.   Please send me a message in MyChart with any questions or updates.  Please see me back in 3-4 weeks.   --Dr. Jordan Likes

## 2021-06-21 NOTE — Progress Notes (Signed)
  Blake Rhodes - 55 y.o. male MRN [id number]  Date of birth: 1965-11-28  SUBJECTIVE:  Including CC & ROS.  No chief complaint on file.   Blake Rhodes is a 55 y.o. male that is presenting with acute left heel pain.  The pain is occurring at the base of the Achilles.  Is no injury or inciting event.  Pain is severe in nature.  Started wearing the cam walker.    Review of Systems See HPI   HISTORY: Past Medical, Surgical, Social, and Family History Reviewed & Updated per EMR.   Pertinent Historical Findings include:  Past Medical History:  Diagnosis Date   Acute low back pain with sciatica    Hyperlipidemia    Sciatica bACK     Past Surgical History:  Procedure Laterality Date   TONSILLECTOMY      Family History  Problem Relation Age of Onset   Diabetes Mother    Dementia Father    High blood pressure Father    Hypertension Father    Mental illness Father    Diabetes Maternal Grandfather    Hypertension Paternal Grandmother    Heart disease Paternal Grandfather     Social History   Socioeconomic History   Marital status: Married    Spouse name: Not on file   Number of children: Not on file   Years of education: Not on file   Highest education level: Not on file  Occupational History   Not on file  Tobacco Use   Smoking status: Never   Smokeless tobacco: Never  Vaping Use   Vaping Use: Never used  Substance and Sexual Activity   Alcohol use: Yes    Alcohol/week: 2.0 standard drinks    Types: 2 Cans of beer per week    Comment: socially   Drug use: No   Sexual activity: Yes  Other Topics Concern   Not on file  Social History Narrative   Not on file   Social Determinants of Health   Financial Resource Strain: Not on file  Food Insecurity: Not on file  Transportation Needs: Not on file  Physical Activity: Not on file  Stress: Not on file  Social Connections: Not on file  Intimate Partner Violence: Not on file     PHYSICAL EXAM:  VS: BP 130/80   Ht  _0  (1.778 m)   Wt 174 lb (78.9 kg)   BMI 24.97 kg/m  Physical Exam Gen: NAD, alert, cooperative with exam, well-appearing   Limited ultrasound: Left Achilles:  Thickening at the insertion with calcific changes and a calcaneal heel spur.  Increased hyperemia observed at the insertion of the Achilles. Normal appearing mid belly of the Achilles. Mild retrocalcaneal bursitis  Summary: Acute tendinitis changes  Ultrasound and interpretation by Clearance Coots, MD     ASSESSMENT & PLAN:   Calcific Achilles tendinitis Acute on chronic in nature.  Changes occurring at the insertion. -Counseled on home exercise therapy and supportive care. -Counseled on CAM Walker. -Referral to physical therapy. -Prednisone. -Could consider shockwave or further imaging. -Check uric acid, ANA panel, sed rate and CRP and HLA-B27.

## 2021-06-21 NOTE — Assessment & Plan Note (Signed)
Acute on chronic in nature.  Changes occurring at the insertion. -Counseled on home exercise therapy and supportive care. -Counseled on CAM Walker. -Referral to physical therapy. -Prednisone. -Could consider shockwave or further imaging. -Check uric acid, ANA panel, sed rate and CRP and HLA-B27.

## 2021-06-21 NOTE — Addendum Note (Signed)
Addended by: Clare Gandy E on: 06/21/2021 01:30 PM   Modules accepted: Orders

## 2021-06-23 ENCOUNTER — Encounter: Payer: Self-pay | Admitting: Family Medicine

## 2021-06-23 ENCOUNTER — Ambulatory Visit (INDEPENDENT_AMBULATORY_CARE_PROVIDER_SITE_OTHER): Payer: 59 | Admitting: Family Medicine

## 2021-06-23 ENCOUNTER — Encounter (HOSPITAL_BASED_OUTPATIENT_CLINIC_OR_DEPARTMENT_OTHER): Payer: Self-pay | Admitting: Physical Therapy

## 2021-06-23 ENCOUNTER — Other Ambulatory Visit: Payer: Self-pay

## 2021-06-23 ENCOUNTER — Ambulatory Visit (HOSPITAL_BASED_OUTPATIENT_CLINIC_OR_DEPARTMENT_OTHER): Payer: 59 | Admitting: Physical Therapy

## 2021-06-23 ENCOUNTER — Other Ambulatory Visit (HOSPITAL_BASED_OUTPATIENT_CLINIC_OR_DEPARTMENT_OTHER): Payer: Self-pay

## 2021-06-23 DIAGNOSIS — R262 Difficulty in walking, not elsewhere classified: Secondary | ICD-10-CM

## 2021-06-23 DIAGNOSIS — M6528 Calcific tendinitis, other site: Secondary | ICD-10-CM

## 2021-06-23 DIAGNOSIS — M25572 Pain in left ankle and joints of left foot: Secondary | ICD-10-CM

## 2021-06-23 DIAGNOSIS — M6281 Muscle weakness (generalized): Secondary | ICD-10-CM

## 2021-06-23 MED ORDER — COLCHICINE 0.6 MG PO TABS
0.6000 mg | ORAL_TABLET | Freq: Two times a day (BID) | ORAL | 2 refills | Status: DC
  Filled 2021-06-23: qty 60, 30d supply, fill #0

## 2021-06-23 NOTE — Progress Notes (Signed)
  Blake Rhodes - 55 y.o. male MRN [id number]  Date of birth: [birth year]  SUBJECTIVE:  Including CC & ROS.  No chief complaint on file.   Blake Rhodes is a 55 y.o. male that is presenting with acute worsening of his left Achilles pain.  Has tried physical therapy and control of topical treatments with limited improvement.  Uric acid was found to be elevated and the swelling does suffer from gout.   Review of Systems See HPI   HISTORY: Past Medical, Surgical, Social, and Family History Reviewed & Updated per EMR.   Pertinent Historical Findings include:  Past Medical History:  Diagnosis Date   Acute low back pain with sciatica    Hyperlipidemia    Sciatica bACK     Past Surgical History:  Procedure Laterality Date   TONSILLECTOMY      Family History  Problem Relation Age of Onset   Diabetes Mother    Dementia Father    High blood pressure Father    Hypertension Father    Mental illness Father    Diabetes Maternal Grandfather    Hypertension Paternal Grandmother    Heart disease Paternal Grandfather     Social History   Socioeconomic History   Marital status: Married    Spouse name: Not on file   Number of children: Not on file   Years of education: Not on file   Highest education level: Not on file  Occupational History   Not on file  Tobacco Use   Smoking status: Never   Smokeless tobacco: Never  Vaping Use   Vaping Use: Never used  Substance and Sexual Activity   Alcohol use: Yes    Alcohol/week: 2.0 standard drinks    Types: 2 Cans of beer per week    Comment: socially   Drug use: No   Sexual activity: Yes  Other Topics Concern   Not on file  Social History Narrative   Not on file   Social Determinants of Health   Financial Resource Strain: Not on file  Food Insecurity: Not on file  Transportation Needs: Not on file  Physical Activity: Not on file  Stress: Not on file  Social Connections: Not on file  Intimate Partner Violence: Not on file      PHYSICAL EXAM:  VS: BP 130/80 (BP Location: Left Arm, Patient Position: Sitting)   Ht 5\' 10"  (1.778 m)   Wt 174 lb (78.9 kg)   BMI 24.97 kg/m  Physical Exam Gen: NAD, alert, cooperative with exam, well-appearing     ASSESSMENT & PLAN:   Calcific Achilles tendinitis Seems more likely related to gout with family history and intermittent pattern of this presentation.  Still awaiting other lab work. -Counseled on home exercise therapy and supportive care. -Colchicine. -Counseled on prednisone.

## 2021-06-23 NOTE — Assessment & Plan Note (Signed)
Seems more likely related to gout with family history and intermittent pattern of this presentation.  Still awaiting other lab work. -Counseled on home exercise therapy and supportive care. -Colchicine. -Counseled on prednisone.

## 2021-06-23 NOTE — Therapy (Addendum)
OUTPATIENT PHYSICAL THERAPY TREATMENT NOTE   Patient Name: Blake Rhodes MRN: [id number] DOB:1965-08-13, 55 y.o., male Today's Date: 06/23/2021  PCP: Vivi Barrack, MD REFERRING PROVIDER: Vivi Barrack, MD   PT End of Session - 06/23/21 0858     Visit Number 2    Number of Visits 12    Date for PT Re-Evaluation 09/19/21    Authorization Type [employer]    PT Start Time [id number]    PT Stop Time 0852    PT Time Calculation (min) 24 min    Activity Tolerance Patient tolerated treatment well    Behavior During Therapy [facility] for tasks assessed/performed             Past Medical History:  Diagnosis Date   Acute low back pain with sciatica    Hyperlipidemia    Sciatica bACK    Past Surgical History:  Procedure Laterality Date   TONSILLECTOMY     Patient Active Problem List   Diagnosis Date Noted   Insomnia 04/26/2021   Sleep-disordered breathing 04/26/2021   Calcific Achilles tendinitis 11/12/2020   Achilles tendon pain 09/24/2020   Dyslipidemia 03/26/2019   Low back pain 03/25/2019   REFERRING PROVIDER: Rosemarie Ax, MD   REFERRING DIAG: [phone] (ICD-10-CM) - Calcific Achilles tendinitis   THERAPY DIAG:  Pain in left ankle and joints of left foot - Plan: PT plan of care cert/re-cert   Difficulty in walking, not elsewhere classified - Plan: PT plan of care cert/re-cert   Muscle weakness (generalized) - Plan: PT plan of care cert/re-cert   ONSET DATE: [id number]   SUBJECTIVE:    SUBJECTIVE STATEMENT: Pt has been wearing the boot and also wears the boot to sleep due to pain.  Pt reports he is hurting and not doing well today.  His pain is worse today than yesterday.  Pt is seeing MD today 11:30.  Pt reports 6-7/10 pain earlier this AM and had difficulty and pain applying weight thru L LE.  Pt has prednisone though hasn't taken it.  Pt felt good after prior Rx.  Pt has a prior HEP and reports he did some stretching last night and has been performing some ROM.  He  also rolled his calf last night.    PERTINENT HISTORY: Chronic Achilles tendinitis    PAIN:  Are you having pain? Yes NRPS scale: 3-4/10 currently Pain location: L achilles and insertion  Pain orientation: Left  PAIN TYPE: sharp Pain description: intermittent  Aggravating factors: walking  Relieving factors: ionto, stretching, [place]   PRECAUTIONS: None      PATIENT GOALS : Pt states he would like to walk normally and get back to running/tennis.      OBJECTIVE:   Pt presents to Rxn with CAM boot and heel lift in place on L LE.     PATIENT SURVEYS:  Unable to take today due to time. Plan for LEFS at next session                  TODAY'S TREATMENT:   Manual therapy:  -Reviewed response to prior Rx, pt presentation, pain level, and home management strategies.  -Pt received STM to L calf and IASTM to L achilles tendon in prone to improve pain and tightness, reduce myofascial adhesions ad restrictions, and to promote proper cross fiber alignment.   -Assessed response to Rx.     -See pt education      PATIENT EDUCATION:  Education details: Answered pt's questions.  POC.  Rationale of MT.  Person educated: Patient Education method: Explanation  Education comprehension: verbalized understanding     HOME EXERCISE PROGRAM: To be updated at next session;    ASSESSMENT:   CLINICAL IMPRESSION: Rx time limited today due to pt arriving 28 mins late.  He presents to Rx stating he is having increased pain.  PT performed STM and IASTM today to improve tightness, pain, and mobility.  Pt has soft tissue tightness in L calf especially in medial calf.  Pt had an appropriate response to STM and IASTM.  He responded well to Rx stating he felt good during MT though had no change in pain after Rx.  Patient should benefit from skilled PT to address impairments and goals and improve overall function.        Objective impairments include Abnormal gait, decreased activity tolerance, decreased  knowledge of condition, decreased mobility, difficulty walking, decreased ROM, decreased strength, hypomobility, increased muscle spasms, impaired flexibility, improper body mechanics, postural dysfunction, and pain. These impairments are limiting patient from community activity, occupation, shopping, and exercise/recreation . Personal factors including Age, Behavior pattern, Profession, and Time since onset of injury/illness/exacerbation are also affecting patient's functional outcome.      GOALS:     SHORT TERM GOALS:   STG Name Target Date Goal status  1 Pt will become independent with HEP in order to demonstrate synthesis of PT education.   Baseline:  07/05/2021 INITIAL  2 Pt will report at least 2 pt reduction on NPRS scale for pain in order to demonstrate functional improvement with household activity, self care, and ADL.   Baseline:  07/12/2021 INITIAL  3 Pt will be able to demonstrate full SL HR without pain on L in order to demonstrate functional improvement in LE function for self-care and house hold duties.    07/12/2021 INITIAL    LONG TERM GOALS:  To be set after re-assessment of STG     PLAN: PT FREQUENCY: 1-2x/week   PT DURATION: 6 weeks   PLANNED INTERVENTIONS: Therapeutic exercises, Therapeutic activity, Neuro Muscular re-education, Balance training, Gait training, Patient/Family education, Joint mobilization, Stair training, Orthotic/Fit training, Aquatic Therapy, Dry Needling, Electrical stimulation, Cryotherapy, Moist heat, scar mobilization, Taping, Vasopneumatic device, Traction, Ultrasound, Ionotophoresis 43m/ml Dexamethasone, and Manual therapy   PLAN FOR NEXT SESSION: STM calf, IASTM, R ankle TCJ post glide, DL eccentric heel raise on step, gastroc stretch at wall with towel arch prop, heel toe rocking, ionto patch, DN/manual therapy, and [place] next session.      RSelinda MichaelsIII PT, [name] 06/23/21 2:09 PM  PHYSICAL THERAPY DISCHARGE SUMMARY  Visits from  Start of Care: 2  Current functional level related to goals / functional outcomes: See above   Remaining deficits: See above   Education / Equipment: Anatomy of condition, POC, HEP, exercise form/rationale    Patient agrees to discharge. Patient goals were not met. Patient is being discharged due to not returning since the last visit. Jessica C. Hightower PT, [name] 09/22/22 11:33 AM

## 2021-06-28 NOTE — Telephone Encounter (Signed)
Called patient at [phone] phone number not in service

## 2021-06-30 ENCOUNTER — Telehealth: Payer: Self-pay | Admitting: Family Medicine

## 2021-06-30 DIAGNOSIS — R768 Other specified abnormal immunological findings in serum: Secondary | ICD-10-CM

## 2021-06-30 LAB — URIC ACID: Uric Acid: 7.1 mg/dL (ref 3.8–8.4)

## 2021-06-30 LAB — HLA-B27 ANTIGEN: HLA B27: NEGATIVE

## 2021-06-30 LAB — ANA,IFA RA DIAG PNL W/RFLX TIT/PATN
ANA Titer 1: NEGATIVE
Cyclic Citrullin Peptide Ab: 126 units — ABNORMAL HIGH (ref 0–19)
Rheumatoid fact SerPl-aCnc: <10 IU/mL (ref <14.0)

## 2021-06-30 LAB — C-REACTIVE PROTEIN: CRP: 2 mg/L (ref 0–10)

## 2021-06-30 LAB — SEDIMENTATION RATE: Sed Rate: 20 mm/hr (ref 0–30)

## 2021-06-30 NOTE — Telephone Encounter (Signed)
Left VM for patient. If he calls back please have inform speak with a nurse/CMA and inform that his anti-ccp is positive and we'll send him to rheumatology.   If any questions then please take the best time and phone number to call and I will try to call him back.   Myra Rude, MD [facility] 06/30/2021, 1:04 PM

## 2021-07-01 ENCOUNTER — Ambulatory Visit: Payer: 59 | Admitting: Rheumatology

## 2021-07-01 ENCOUNTER — Ambulatory Visit: Payer: Self-pay

## 2021-07-01 ENCOUNTER — Encounter: Payer: Self-pay | Admitting: Rheumatology

## 2021-07-01 ENCOUNTER — Other Ambulatory Visit: Payer: Self-pay

## 2021-07-01 ENCOUNTER — Ambulatory Visit: Payer: 59 | Attending: Family Medicine | Admitting: Physical Therapy

## 2021-07-01 VITALS — BP 138/89 | HR 69 | Ht 70.0 in | Wt 176.6 lb

## 2021-07-01 DIAGNOSIS — R768 Other specified abnormal immunological findings in serum: Secondary | ICD-10-CM

## 2021-07-01 DIAGNOSIS — M79671 Pain in right foot: Secondary | ICD-10-CM

## 2021-07-01 DIAGNOSIS — M545 Low back pain, unspecified: Secondary | ICD-10-CM

## 2021-07-01 DIAGNOSIS — Z8269 Family history of other diseases of the musculoskeletal system and connective tissue: Secondary | ICD-10-CM

## 2021-07-01 DIAGNOSIS — G8929 Other chronic pain: Secondary | ICD-10-CM

## 2021-07-01 DIAGNOSIS — M79672 Pain in left foot: Secondary | ICD-10-CM | POA: Diagnosis not present

## 2021-07-01 DIAGNOSIS — F5101 Primary insomnia: Secondary | ICD-10-CM

## 2021-07-01 MED ORDER — DICLOFENAC SODIUM 3 % EX GEL: CUTANEOUS | 2 refills | Status: DC

## 2021-07-01 NOTE — Progress Notes (Signed)
Office Visit Note  Patient: Blake Rhodes             Date of Birth: 1966/07/10           MRN: [id number]             PCP: Vivi Barrack, MD Referring: Vivi Barrack, MD Visit Date: 07/01/2021 Occupation: @GUAROCC @  Subjective:  Bilateral heel pain.   History of Present Illness: Blake Rhodes is a 55 y.o. male seen in consultation per request of his PCP for the evaluation of heel pain.  He was seen today on urgent basis due to severe pain and discomfort in his heels.  According the patient in 2015 he developed some lower back pain while he was living in [place].  The symptoms resolved after physical therapy.  He moved to [facility] in 2018 and in 2019 he started experiencing pain and discomfort in his right heel which he describes in the back of his heel.  He states the pain gradually moved to the left heel as well.  He was initially evaluated at [facility] office where he had x-rays.  He was told that he had bilateral heel spurs and was given braces and boots.  He also had physical therapy and prednisone taper which resolved his symptoms.  He states the symptoms recurred 6 to 8 months later but responded again to physical therapy.  Since then he has been having recurrent symptoms about every 3 to 4 months.  He states physical therapy helps.  He also tried ionotopheresis which has been helpful.  He was recently evaluated by Dr. Hulan Saas and Dr. Raeford Razor.  He had shockwave therapy by Dr. Raeford Razor which helped temporarily.  He states that wearing tennis shoes relieves his symptoms but when he wears his dress shoes the symptoms recur.  Physical therapy, icing and iontophoresis has been helpful.  He recently had labs done by Dr. Raeford Razor which showed elevated anti-CCP.  His uric acid was 7.1.  Dr. Raeford Razor discussed possibility of gout and I started him on colchicine.   He took colchicine for couple of days at 0.6 mg p.o. twice daily but he had to discontinue due to GI side effects.  He felt that  his symptoms resolved after taking couple of doses of colchicine.  He was also given a prednisone taper which he did not take.  None of the other joints are painful.  He gives history of occasional discomfort in the the right SI joint when he lifts weights.  There is no family history of autoimmune disease.  His father has gout.    Activities of Daily Living:  Patient reports morning stiffness for 0 minute.   Patient Denies nocturnal pain.  Difficulty dressing/grooming: Denies Difficulty climbing stairs: Denies Difficulty getting out of chair: Denies Difficulty using hands for taps, buttons, cutlery, and/or writing: Denies  Review of Systems  Constitutional:  Negative for fatigue.  HENT:  Negative for mouth sores, mouth dryness and nose dryness.   Eyes:  Negative for pain, itching and dryness.  Respiratory:  Negative for shortness of breath and difficulty breathing.   Cardiovascular:  Negative for chest pain and palpitations.  Gastrointestinal:  Negative for blood in stool, constipation and diarrhea.  Endocrine: Negative for increased urination.  Genitourinary:  Negative for difficulty urinating.  Musculoskeletal:  Negative for joint pain, joint pain, joint swelling, myalgias, morning stiffness, muscle tenderness and myalgias.  Skin:  Negative for color change, rash and redness.  Allergic/Immunologic: Negative for susceptible  to infections.  Neurological:  Negative for dizziness, numbness, headaches, memory loss and weakness.  Hematological:  Negative for bruising/bleeding tendency.  Psychiatric/Behavioral:  Negative for confusion.    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   Achilles tendon pain 09/24/2020   Dyslipidemia 03/26/2019   Low back pain 03/25/2019    Past Medical History:  Diagnosis Date   Acute low back pain with sciatica     Hyperlipidemia    Sciatica bACK     Family History  Problem Relation Age of Onset   Diabetes Mother    Thyroid disease Mother    Dementia Father    High blood pressure Father    Hypertension Father    Mental illness Father    Healthy Brother    Diabetes Maternal Grandfather    Hypertension Paternal Grandmother    Heart disease Paternal Grandfather    Healthy Son    Past Surgical History:  Procedure Laterality Date   TONSILLECTOMY     Social History   Social History Narrative   Not on file   Immunization History  Administered Date(s) Administered   Influenza,inj,Quad PF,6+ Mos 03/25/2019   Influenza-Unspecified 04/30/2021   PFIZER(Purple Top)SARS-COV-2 Vaccination 08/21/2019, 09/18/2019, 06/13/2020     Objective: Vital Signs: BP 138/89 (BP Location: Right Arm, Patient Position: Sitting, Cuff Size: Normal)   Pulse 69   Ht 5\' 10"  (1.778 m)   Wt 176 lb 9.6 oz (80.1 kg)   BMI 25.34 kg/m    Physical Exam Vitals and nursing note reviewed.  Constitutional:      Appearance: He is well-developed.  HENT:     Head: Normocephalic and atraumatic.  Eyes:     Conjunctiva/sclera: Conjunctivae normal.     Pupils: Pupils are equal, round, and reactive to light.  Cardiovascular:     Rate and Rhythm: Normal rate and regular rhythm.     Heart sounds: Normal heart sounds.  Pulmonary:     Effort: Pulmonary effort is normal.     Breath sounds: Normal breath sounds.  Abdominal:     General: Bowel sounds are normal.     Palpations: Abdomen is soft.  Musculoskeletal:     Cervical back: Normal range of motion and neck supple.  Skin:    General: Skin is warm and dry.     Capillary Refill: Capillary refill takes less than 2 seconds.  Neurological:     Mental Status: He is alert and oriented to person, place, and time.  Psychiatric:        Behavior: Behavior normal.     Musculoskeletal Exam: C-spine, thoracic and lumbar spine were in good range of motion.  He had no SI joint  tenderness.  Shoulder joints, elbow joints, wrist joints, MCPs PIPs and DIPs with good range of motion with no synovitis.  Hip joints, knee joints, ankles, MTPs and PIPs with good range of motion with no synovitis.  He tenderness over bilateral posterior calcaneal spurs.  No inflammation was noted.  CDAI Exam: CDAI Score: -- Patient Global: --; Provider Global: -- Swollen: --; Tender: -- Joint Exam 07/01/2021   No joint exam has been documented for this visit   There is currently no information documented on the homunculus. Go to the Rheumatology activity and complete the homunculus joint exam.  Investigation: No additional findings.  Imaging: XR Foot 2 Views Left  Result Date: 07/01/2021 No  MTP, PIP or DIP narrowing was noted.  No intertarsal, tibiotalar or subtalar joint space narrowing was noted.  Large posterior calcaneal spur and a small inferior calcaneal spur was noted. Impression: These findings are consistent with osteoarthritic changes with large posterior calcaneal spur.  XR Foot 2 Views Right  Result Date: 07/01/2021 Mild first MTP narrowing was noted.  No PIP, DIP, intertarsal, tibiotalar or subtalar joint space narrowing was noted.  Large posterior calcaneal spur and a small inferior calcaneal spur was noted.  No erosive changes were noted.  No chondrocalcinosis was noted. Impression: These findings are consistent with osteoarthritis and inferior and posterior calcaneal spurs.   Recent Labs: Lab Results  Component Value Date   WBC 5.2 04/26/2021   HGB 14.8 04/26/2021   PLT 151.0 04/26/2021   NA 137 04/26/2021   K 4.2 04/26/2021   CL 101 04/26/2021   CO2 27 04/26/2021   GLUCOSE 108 (H) 04/26/2021   BUN 10 04/26/2021   CREATININE 1.01 04/26/2021   BILITOT 0.5 04/26/2021   ALKPHOS 72 04/26/2021   AST 35 04/26/2021   ALT 32 04/26/2021   PROT 6.8 04/26/2021   ALBUMIN 4.5 04/26/2021   CALCIUM 10.0 04/26/2021    June 21, 2021 ANA negative, RF negative,  anti-CCP 126, sed rate 20, CRP 2, uric acid 7.1  Speciality Comments: No specialty comments available.  Procedures:  No procedures performed Allergies: Patient has no known allergies.   Assessment / Plan:     Visit Diagnoses: Bilateral foot pain -patient has been experiencing intermittent pain and discomfort over the posterior aspect of his heels since 2019.  He has tried physical therapy, iontophoresis, icing and shockwave therapy.  He states only iontophoresis has been helpful so far.  He had recent labs through Dr. Jordan LikesSchmitz which showed positive anti-CCP antibody and mildly elevated uric acid.  He does not give typical history of inflammatory arthritis or gout.  I do not see any synovitis on the examination.  The x-rays of bilateral feet were unremarkable except for her large posterior calcaneal spurs.  I believe most of the symptoms are due to the spurs.  He may benefit from proper fitting shoes with some padding around the heel area.  He will try new shoes.  If his symptoms persist he may need surgical resection of the spurs.  Plan: XR Foot 2 Views Right, XR Foot 2 Views Left.  X-ray showed large posterior calcaneal spurs and a small inferior calcaneal spur.  X-ray findings were discussed with the patient.  No erosive changes were noted.  No joint space narrowing was noted.  Use of topical Voltaren gel was discussed.  Side effects were discussed.  A prescription was sent.  I reviewed ultrasound of the left Achilles tendon done by Dr. Jordan LikesSchmitz which showed thickening at the insertion of the Achilles tendon with calcific changes at the calcaneal heel spur.  Dr. Jordan LikesSchmitz also noted increased hyperemia at the insertion of the Achilles tendon.  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  The significance of anti-CCP antibody and association with rheumatoid arthritis was discussed.  Symptoms anti-CCP antibody could be seen several years prior to the onset of rheumatoid arthritis.  It can be  also seen in the normal population without autoimmune disease.  At this point I do not see any synovitis.  I advised him to contact me in case he develops any increased pain or swelling.  Chronic midline low back pain without sciatica-he had lower back pain in the past  with sciatica symptoms which resolved.  He denies any lower back pain currently.  He states that he has occasional discomfort in his lower back if he doing a strength training.  Primary insomnia-he gives history of intermittent insomnia.  He has been taking melatonin which has been helpful.  Family history of gout- in his father.  Orders: Orders Placed This Encounter  Procedures   XR Foot 2 Views Right   XR Foot 2 Views Left   Meds ordered this encounter  Medications   Diclofenac Sodium 3 % GEL    Sig: Apply 2-4 grams to affected joint 4 times daily as needed.    Dispense:  400 g    Refill:  2     Follow-Up Instructions: Return if symptoms worsen or fail to improve, for Heel pain.   Bo Merino, MD  Note - This record has been created using Editor, commissioning.  Chart creation errors have been sought, but may not always  have been located. Such creation errors do not reflect on  the standard of medical care.

## 2021-07-02 ENCOUNTER — Ambulatory Visit: Payer: 59 | Admitting: Physical Therapy

## 2021-08-23 ENCOUNTER — Encounter: Payer: Self-pay | Admitting: Family

## 2021-08-23 ENCOUNTER — Encounter: Payer: Self-pay | Admitting: Family Medicine

## 2021-08-23 ENCOUNTER — Ambulatory Visit (INDEPENDENT_AMBULATORY_CARE_PROVIDER_SITE_OTHER): Payer: 59 | Admitting: Family

## 2021-08-23 VITALS — BP 154/73 | HR 83 | Temp 98.7°F | Ht 70.0 in | Wt 213.0 lb

## 2021-08-23 DIAGNOSIS — R6889 Other general symptoms and signs: Secondary | ICD-10-CM | POA: Diagnosis not present

## 2021-08-23 DIAGNOSIS — U071 COVID-19: Secondary | ICD-10-CM | POA: Diagnosis not present

## 2021-08-23 LAB — POC COVID19 BINAXNOW: SARS Coronavirus 2 Ag: POSITIVE — AB

## 2021-08-23 MED ORDER — NIRMATRELVIR/RITONAVIR (PAXLOVID)TABLET: 3.0000 | ORAL_TABLET | Freq: Two times a day (BID) | ORAL | 0 refills | Status: AC

## 2021-08-23 NOTE — Progress Notes (Signed)
Subjective:     Patient ID: Blake Rhodes, male    DOB: 1966/06/30, 56 y.o.   MRN: [id number]  Chief Complaint  Patient presents with   Headache    All symptoms started Sunday. He started out taking Tylenol, but states that Advil helps better. He denies Chest Pain, SOB, nasal congestion.   Fever    101.3   Chills        Fatigue    Mild    HPI: Upper Respiratory Infection: Symptoms include congestion, fever 101, headache described as all over, non productive cough, and chills .  Onset of symptoms was 1 day ago, gradually worsening since that time. He is drinking moderate amounts of fluids. Evaluation to date: none.  Treatment to date:  Advil, Tylenol .    Health Maintenance Due  Topic Date Due   HIV Screening  Never done   Fecal DNA (Cologuard)  Never done   Zoster Vaccines- Shingrix (1 of 2) Never done    Past Medical History:  Diagnosis Date   Acute low back pain with sciatica    Hyperlipidemia    Sciatica bACK     Past Surgical History:  Procedure Laterality Date   TONSILLECTOMY      Outpatient Medications Prior to Visit  Medication Sig Dispense Refill   traZODone (DESYREL) 50 MG tablet Take 1 tablet (50 mg total) by mouth at bedtime. 30 tablet 0   triamcinolone ointment (KENALOG) 0.1 % Apply 1 application topically 2 (two) times daily. To affected areas 60 g 0   Diclofenac Sodium 3 % GEL Apply 2-4 grams to affected joint 4 times daily as needed. (Patient not taking: Reported on 08/23/2021) 400 g 2   predniSONE (DELTASONE) 20 MG tablet TAKE 3 TABS BY MOUTH ONCE DAILY FOR 3 DAYS, 2 TABLETS FOR 3 DAYS, 1 TABLET FOR 2 DAYS THEN 1/2 TABLET FOR 2 DAYS (Patient not taking: Reported on 07/01/2021) 18 tablet 0   colchicine 0.6 MG tablet Take 1 tablet (0.6 mg total) by mouth 2 (two) times daily. (Patient not taking: Reported on 07/01/2021) 60 tablet 2   No facility-administered medications prior to visit.    No Known Allergies      Objective:    Physical  Exam Vitals and nursing note reviewed.  Constitutional:      General: He is not in acute distress.    Appearance: Normal appearance.  HENT:     Head: Normocephalic.  Cardiovascular:     Rate and Rhythm: Normal rate and regular rhythm.  Pulmonary:     Effort: Pulmonary effort is normal.     Breath sounds: Normal breath sounds.  Musculoskeletal:        General: Normal range of motion.     Cervical back: Normal range of motion.  Skin:    General: Skin is warm and dry.  Neurological:     Mental Status: He is alert and oriented to person, place, and time.  Psychiatric:        Mood and Affect: Mood normal.    BP (!) 154/73    Pulse 83    Temp 98.7 F (37.1 C) (Temporal)    Ht 5\' 10"  (1.778 m)    Wt 213 lb (96.6 kg)    SpO2 97%    BMI 30.56 kg/m  Wt Readings from Last 3 Encounters:  08/23/21 213 lb (96.6 kg)  07/01/21 176 lb 9.6 oz (80.1 kg)  06/23/21 174 lb (78.9 kg)  Assessment & Plan:   Problem List Items Addressed This Visit       Other   COVID-19    Sending Paxlovid, pt advised of FDA label for emergency use, how to take, & SE. Advised of CDC guidelines for self isolation/ ending isolation.  Advised of safe practice guidelines. Symptom Tier reviewed.  Encouraged to monitor for any worsening symptoms; watch for increased shortness of breath, weakness, and signs of dehydration. Instructed to rest and hydrate well.  Advised to wear a mask if necessary to leave the house.       Relevant Medications   nirmatrelvir/ritonavir EUA (PAXLOVID) 20 x 150 MG & 10 x 100MG  TABS   Other Visit Diagnoses     Flu-like symptoms    -  Primary   Relevant Orders   POC COVID-19 (Completed)       Meds ordered this encounter  Medications   nirmatrelvir/ritonavir EUA (PAXLOVID) 20 x 150 MG & 10 x 100MG  TABS    Sig: Take 3 tablets by mouth 2 (two) times daily for 5 days. (Take nirmatrelvir 150 mg two tablets twice daily for 5 days and ritonavir 100 mg one tablet twice daily for 5  days) Patient GFR is 86    Dispense:  30 tablet    Refill:  0    Order Specific Question:   Supervising Provider    Answer:   ANDY, CAMILLE L [[id number]]

## 2021-08-23 NOTE — Assessment & Plan Note (Signed)
Sending Paxlovid, pt advised of FDA label for emergency use, how to take, & SE. Advised of CDC guidelines for self isolation/ ending isolation.  Advised of safe practice guidelines. Symptom Tier reviewed.  Encouraged to monitor for any worsening symptoms; watch for increased shortness of breath, weakness, and signs of dehydration. Instructed to rest and hydrate well.  Advised to wear a mask if necessary to leave the house. ° °

## 2021-08-23 NOTE — Patient Instructions (Addendum)
It was very nice to see you today.  You are positive for Covid-19 virus. I have sent Paxlovid medication to your pharmacy, start this today. You should stay home and wear a mask (avoid infecting others at home) for 5 days from the start of symptoms. OK to continue taking Advil, Tylenol, or other sinus/cough meds. Also ok to continue Trazodone at night for sleep.  Call back if you are not better after finishing the medication.     PLEASE NOTE:  If you had any lab tests please let us know if you have not heard back within a few days. You may see your results on MyChart before we have a chance to review them but we will give you a call once they are reviewed by [place]. If we ordered any referrals today, please let us know if you have not heard from their office within the next week.   Please try these tips to maintain a healthy lifestyle:  Eat most of your calories during the day when you are active. Eliminate processed foods including packaged sweets (pies, cakes, cookies), reduce intake of potatoes, white bread, white pasta, and white rice. Look for whole grain options, oat flour or almond flour.  Each meal should contain half fruits/vegetables, one quarter protein, and one quarter carbs (no bigger than a computer mouse).  Cut down on sweet beverages. This includes juice, soda, and sweet tea. Also watch fruit intake, though this is a healthier sweet option, it still contains natural sugar! Limit to 3 servings daily.  Drink at least 1 glass of water with each meal and aim for at least 8 glasses per day  Exercise at least 150 minutes every week.

## 2021-08-27 IMAGING — DX DG ANKLE COMPLETE 3+V*R*
3 series · 3 of 3 positions shown · non-contrast
Comparison: None.

CLINICAL DATA: Lateral and posterior ankle pain with difficulty
weight-bearing intermittently for 3 years

EXAM:
RIGHT ANKLE - COMPLETE 3+ VIEW

[ankle ap]
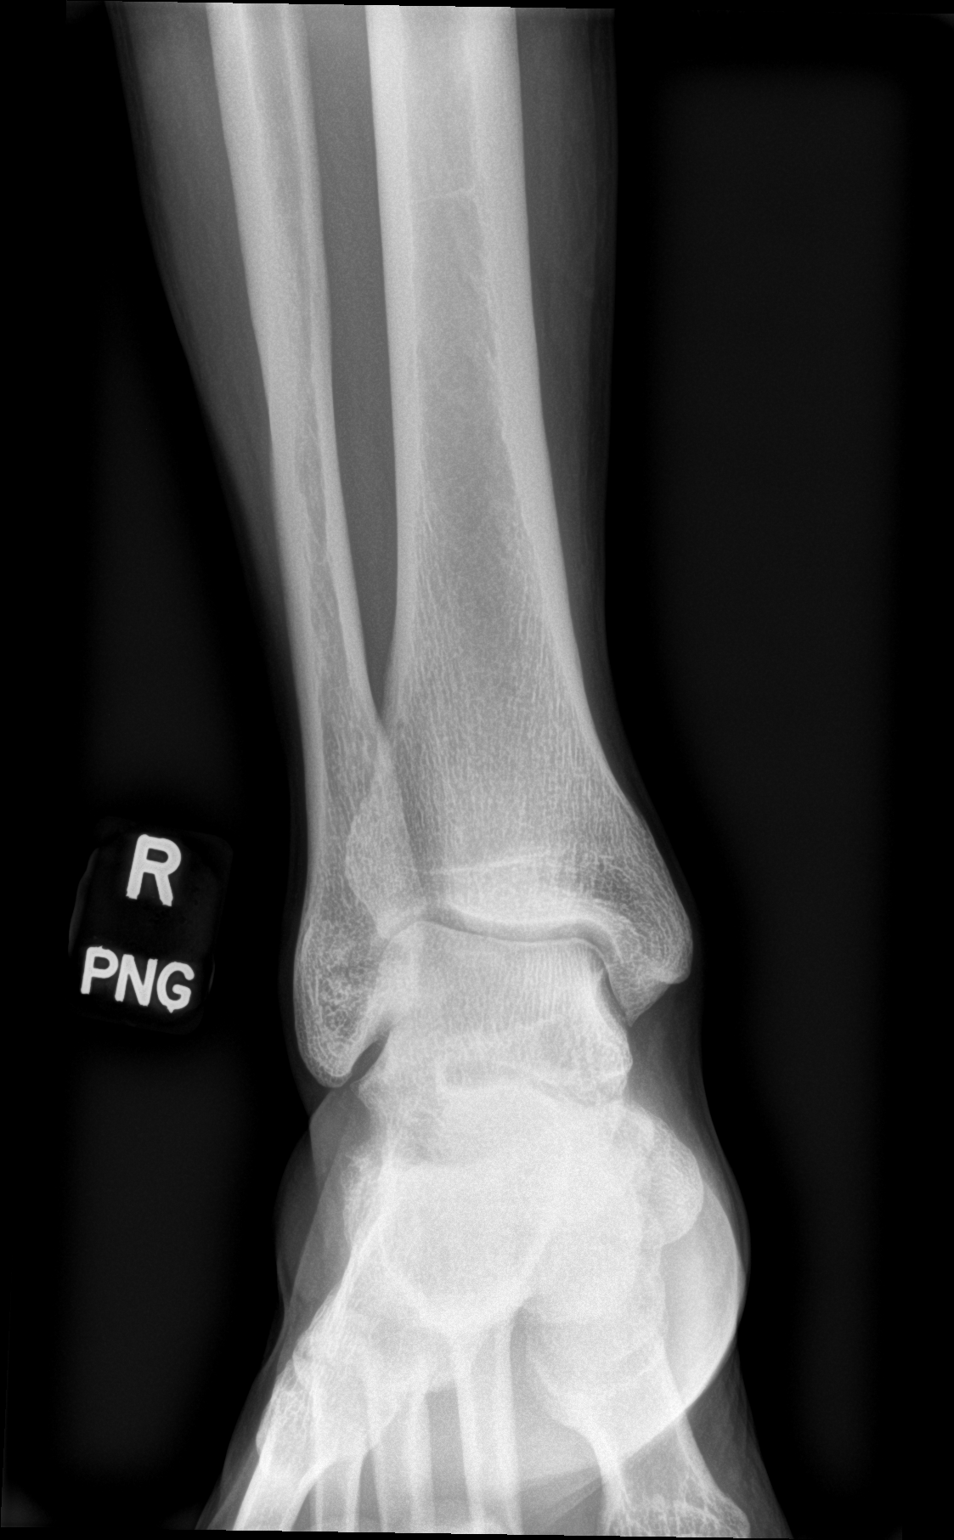

[ankle obl]
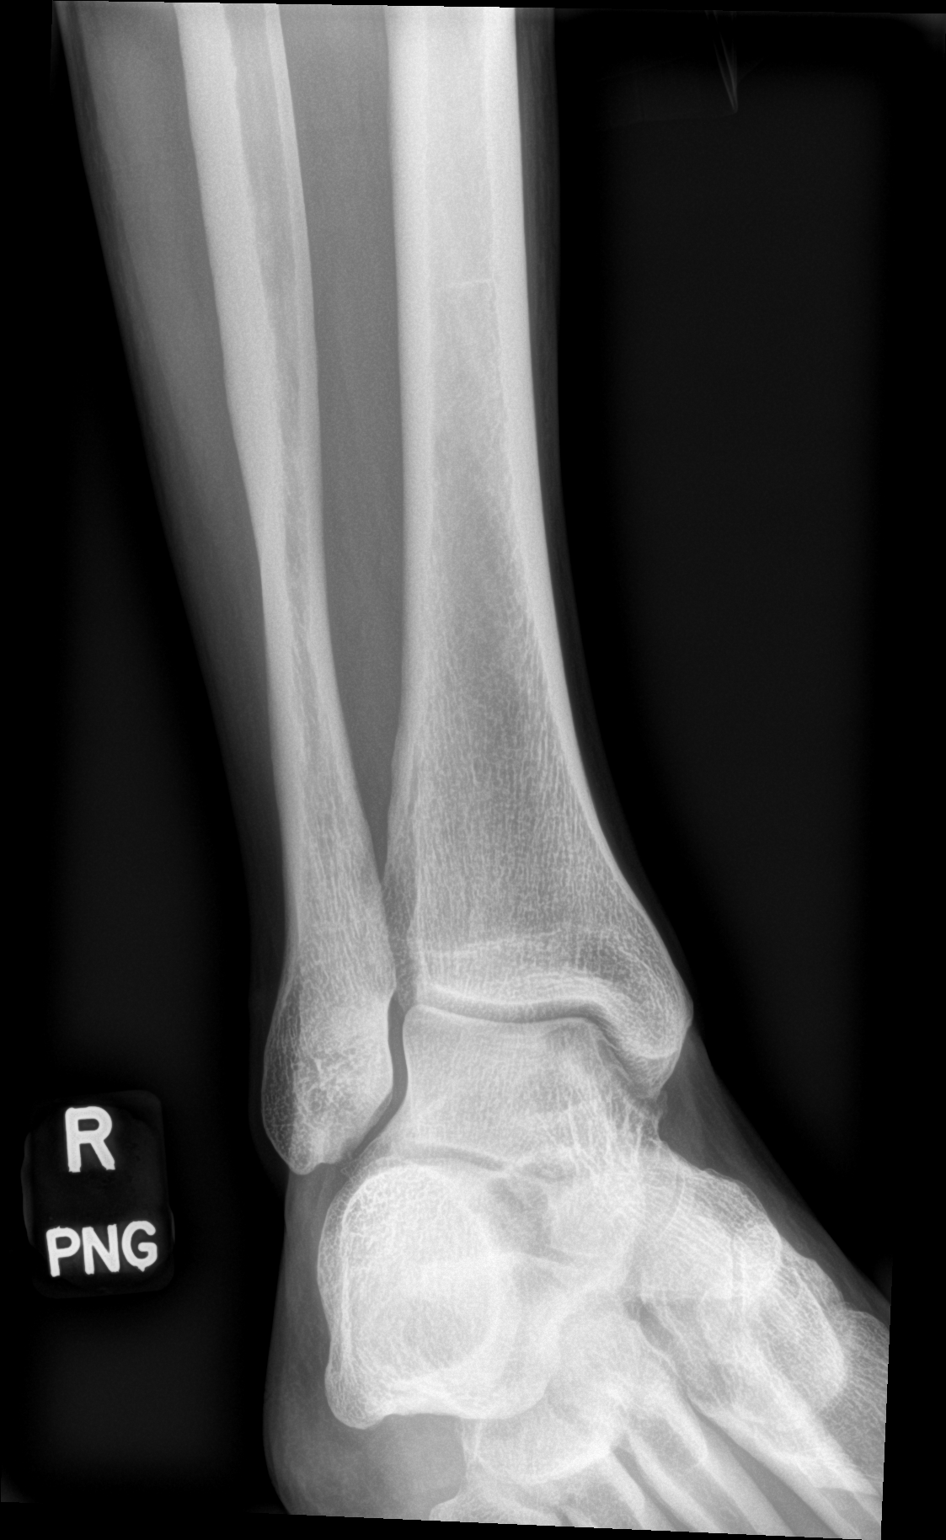

[ankle lat]
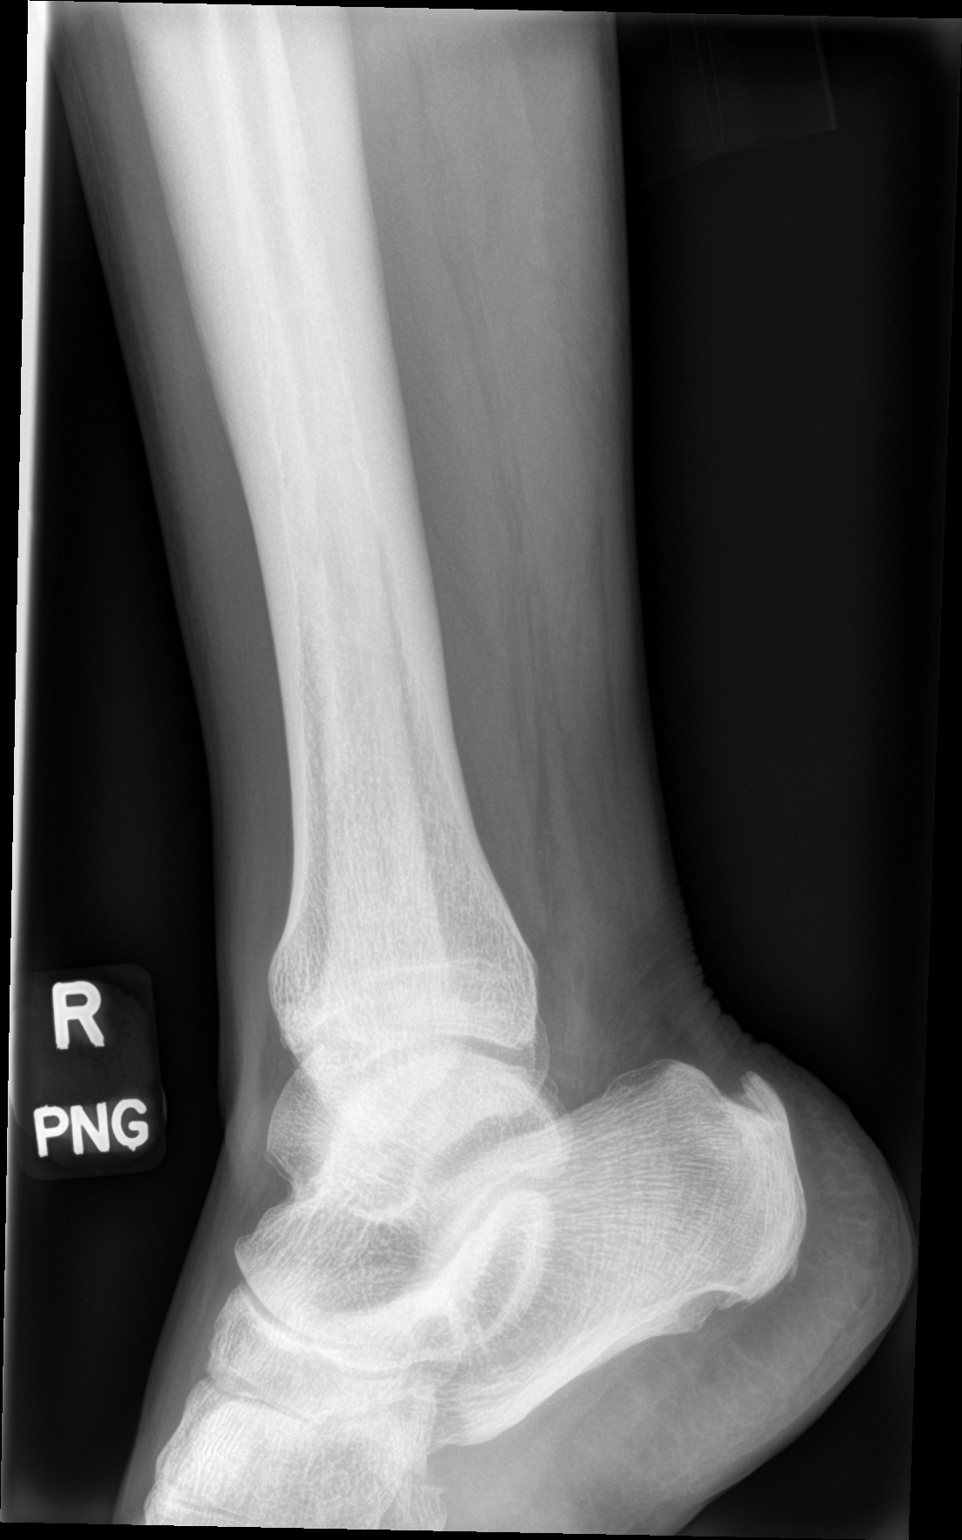

[3 of 3 positions shown; findings below may reference images not displayed]

FINDINGS: There is no evidence of fracture, dislocation, or joint effusion.
Achilles enthesophyte without visible tendon thickening.
IMPRESSION: No degenerative joint narrowing or spurring.

Achilles enthesophyte.

## 2021-08-30 ENCOUNTER — Encounter: Payer: Self-pay | Admitting: Family

## 2021-10-01 ENCOUNTER — Ambulatory Visit: Payer: 59 | Admitting: Family Medicine

## 2021-10-01 ENCOUNTER — Encounter: Payer: Self-pay | Admitting: Family Medicine

## 2021-10-01 ENCOUNTER — Other Ambulatory Visit: Payer: Self-pay

## 2021-10-01 VITALS — BP 150/80 | HR 73 | Temp 98.2°F | Ht 70.0 in | Wt 172.4 lb

## 2021-10-01 DIAGNOSIS — G4730 Sleep apnea, unspecified: Secondary | ICD-10-CM | POA: Diagnosis not present

## 2021-10-01 DIAGNOSIS — F5102 Adjustment insomnia: Secondary | ICD-10-CM

## 2021-10-01 DIAGNOSIS — R062 Wheezing: Secondary | ICD-10-CM | POA: Diagnosis not present

## 2021-10-01 DIAGNOSIS — R61 Generalized hyperhidrosis: Secondary | ICD-10-CM

## 2021-10-01 MED ORDER — TRAZODONE HCL 50 MG PO TABS: 50.0000 mg | ORAL_TABLET | Freq: Every day | ORAL | 0 refills | Status: DC

## 2021-10-01 MED ORDER — ALBUTEROL SULFATE HFA 108 (90 BASE) MCG/ACT IN AERS: 2.0000 | INHALATION_SPRAY | Freq: Four times a day (QID) | RESPIRATORY_TRACT | 1 refills | Status: DC | PRN

## 2021-10-01 NOTE — Patient Instructions (Signed)
It was very nice to see you today! ? ?[address] for x-ray.  830-5 M-F excep 1230-1.  Walk in ? ? ?PLEASE NOTE: ? ?If you had any lab tests please let us know if you have not heard back within a few days. You may see your results on MyChart before we have a chance to review them but we will give you a call once they are reviewed by [place]. If we ordered any referrals today, please let us know if you have not heard from their office within the next week.  ? ?Please try these tips to maintain a healthy lifestyle: ? ?Eat most of your calories during the day when you are active. Eliminate processed foods including packaged sweets (pies, cakes, cookies), reduce intake of potatoes, white bread, white pasta, and white rice. Look for whole grain options, oat flour or almond flour. ? ?Each meal should contain half fruits/vegetables, one quarter protein, and one quarter carbs (no bigger than a computer mouse). ? ?Cut down on sweet beverages. This includes juice, soda, and sweet tea. Also watch fruit intake, though this is a healthier sweet option, it still contains natural sugar! Limit to 3 servings daily. ? ?Drink at least 1 glass of water with each meal and aim for at least 8 glasses per day ? ?Exercise at least 150 minutes every week.   ?

## 2021-10-01 NOTE — Progress Notes (Signed)
? ?Subjective:  ? ? ? Patient ID: Blake Rhodes, male    DOB: 1965-09-05, 56 y.o.   MRN: [id number] ? ?Chief Complaint  ?Patient presents with  ? Wheezing  ?  Having some wheezing, more at night ?Happening for a couple of weeks, had Covid January 20th ?  ? Loss of taste  ?  Food doesn't taste the same ?  ? ? ?HPI ?Wheezing some at night ?Had covid 08/23/21-tx'd w/paxlovid.for 3 days and stopped as metallic taste. Had cough for 10days and then resolved. ?Past 1wks-wheezing-more after eats and at night. - used to happen in [place]-when running-Spring/winter(2007).  Occ cough that resolves w/hot tea   In 2012-PFT's-got albuterol inhaler.  OK for past sev years. Usu occurs after cold/cough.  Has inhaler from [place].  Keeps happening esp at night after dinner. Didn't go to gym yesterday. No problem last pm.  Occ heartburn.  Last wk for 2 days, a pain to L of sternum and tender to push on it.  Exercises 5x/k-no sob, etc.  ? ?Still w/taste disturbances-salt tastes funny.  ? ?Health Maintenance Due  ?Topic Date Due  ? HIV Screening  Never done  ? Fecal DNA (Cologuard)  Never done  ? Zoster Vaccines- Shingrix (1 of 2) Never done  ? COVID-19 Vaccine (4 - Booster for Pfizer series) 08/08/2020  ? ? ?Past Medical History:  ?Diagnosis Date  ? Acute low back pain with sciatica   ? Hyperlipidemia   ? Sciatica bACK   ? ? ?Past Surgical History:  ?Procedure Laterality Date  ? TONSILLECTOMY    ? ? ?Outpatient Medications Prior to Visit  ?Medication Sig Dispense Refill  ? Diclofenac Sodium 3 % GEL Apply 2-4 grams to affected joint 4 times daily as needed. 400 g 2  ? traZODone (DESYREL) 50 MG tablet Take 1 tablet (50 mg total) by mouth at bedtime. 30 tablet 0  ? predniSONE (DELTASONE) 20 MG tablet TAKE 3 TABS BY MOUTH ONCE DAILY FOR 3 DAYS, 2 TABLETS FOR 3 DAYS, 1 TABLET FOR 2 DAYS THEN 1/2 TABLET FOR 2 DAYS (Patient not taking: Reported on 07/01/2021) 18 tablet 0  ? triamcinolone ointment (KENALOG) 0.1 % Apply 1 application topically 2 (two)  times daily. To affected areas (Patient not taking: Reported on 10/01/2021) 60 g 0  ? ?No facility-administered medications prior to visit.  ? ? ?No Known Allergies ?ROS neg/noncontributory except as noted HPI/below ?Sweating a lot since covid at night only.   In [place] 07/2019-01-31.  Mom deceased  ? ?Sleep neuro-wants referral-snores some.  Hard to sleep.  Tired  naps at 5pm usu.  Not fall asleep driving   ? ?   ?Objective:  ?  ? ?BP (!) 150/80   Pulse 73   Temp 98.2 ?F (36.8 ?C) (Temporal)   Ht 5\' 10"  (1.778 m)   Wt 172 lb 6 oz (78.2 kg)   SpO2 98%   BMI 24.73 kg/m?  ?Wt Readings from Last 3 Encounters:  ?10/01/21 172 lb 6 oz (78.2 kg)  ?08/23/21 213 lb (96.6 kg)  ?07/01/21 176 lb 9.6 oz (80.1 kg)  ? ?Vitals with BMI 10/01/2021 08/23/2021 07/01/2021 07/01/2021 06/23/2021 06/21/2021  ?Height 5\' 10"  5\' 10"  - 5\' 10"  5\' 10"  5\' 10"   ?Weight 172 lbs 6 oz 213 lbs - 176 lbs 10 oz 174 lbs 174 lbs  ?BMI 24.73 30.56 - 25.34 24.97 24.97  ?Systolic 150 154 06/23/2021 138 130 130  ?Diastolic 80 73 89 88 80 80  ?Pulse  73 83 69 71 - -  ?  ?I believe the 213 was in error-pt agrees ?   ? ?Gen: WDWN NAD ?HEENT: NCAT, conjunctiva not injected, sclera nonicteric ?TM WNL B, OP moist, no exudates  ?NECK:  supple, no thyromegaly, no nodes, no carotid bruits ?CARDIAC: RRR, S1S2+, no murmur. DP 2+B ?LUNGS: CTAB. No wheezes ?EXT:  no edema ?MSK: no gross abnormalities.  ?NEURO: A&O x3.  CN II-XII intact.  ?PSYCH: normal mood. Good eye contact ? ?Assessment & Plan:  ? ?Problem List Items Addressed This Visit   ? ?  ? Other  ? Insomnia  ? Sleep-disordered breathing  ? ?Other Visit Diagnoses   ? ? Wheezing    -  Primary  ? Relevant Orders  ? DG Chest 2 View  ? Night sweats      ? ?  ? Wheezing-acute but has had in past.  ?post covid inflammation, GERD, EIA/allergy, other.  Declines PPI for now.  Will do albuterol prn and poss pre work out.  Will check cxr as night sweats ?Night sweats-?post covid, poss TB, other.  Will check CXR.   ?Insomnia-refilled  trazodone for now ?Sleep disordered breathing.  Req referral neuro ? ?Meds ordered this encounter  ?Medications  ? traZODone (DESYREL) 50 MG tablet  ?  Sig: Take 1 tablet (50 mg total) by mouth at bedtime.  ?  Dispense:  30 tablet  ?  Refill:  0  ? albuterol (VENTOLIN HFA) 108 (90 Base) MCG/ACT inhaler  ?  Sig: Inhale 2 puffs into the lungs every 6 (six) hours as needed for wheezing or shortness of breath.  ?  Dispense:  8 g  ?  Refill:  1  ? ? ?Angelena Sole, MD ? ?

## 2021-11-10 ENCOUNTER — Other Ambulatory Visit: Payer: Self-pay | Admitting: Family Medicine

## 2021-12-16 ENCOUNTER — Other Ambulatory Visit (HOSPITAL_BASED_OUTPATIENT_CLINIC_OR_DEPARTMENT_OTHER): Payer: Self-pay

## 2021-12-16 MED ORDER — TRAZODONE HCL 50 MG PO TABS
ORAL_TABLET | ORAL | 1 refills | Status: DC
  Filled 2021-12-16 – 2022-01-04 (×2): qty 90, 90d supply, fill #0

## 2021-12-16 MED ORDER — ALBUTEROL SULFATE HFA 108 (90 BASE) MCG/ACT IN AERS
INHALATION_SPRAY | RESPIRATORY_TRACT | 0 refills | Status: DC
  Filled 2021-12-16 – 2022-01-04 (×2): qty 6.7, 25d supply, fill #0

## 2021-12-24 ENCOUNTER — Other Ambulatory Visit (HOSPITAL_BASED_OUTPATIENT_CLINIC_OR_DEPARTMENT_OTHER): Payer: Self-pay

## 2022-01-04 ENCOUNTER — Other Ambulatory Visit (HOSPITAL_BASED_OUTPATIENT_CLINIC_OR_DEPARTMENT_OTHER): Payer: Self-pay

## 2022-04-14 ENCOUNTER — Encounter: Payer: Self-pay | Admitting: Family Medicine

## 2022-04-14 ENCOUNTER — Ambulatory Visit: Payer: 59 | Admitting: Family Medicine

## 2022-04-14 ENCOUNTER — Telehealth: Payer: Self-pay | Admitting: Internal Medicine

## 2022-04-14 ENCOUNTER — Other Ambulatory Visit (HOSPITAL_BASED_OUTPATIENT_CLINIC_OR_DEPARTMENT_OTHER): Payer: Self-pay

## 2022-04-14 ENCOUNTER — Ambulatory Visit: Payer: Self-pay

## 2022-04-14 VITALS — BP 128/80 | Ht 70.0 in | Wt 172.0 lb

## 2022-04-14 DIAGNOSIS — S76302A Unspecified injury of muscle, fascia and tendon of the posterior muscle group at thigh level, left thigh, initial encounter: Secondary | ICD-10-CM

## 2022-04-14 DIAGNOSIS — S76312A Strain of muscle, fascia and tendon of the posterior muscle group at thigh level, left thigh, initial encounter: Secondary | ICD-10-CM | POA: Diagnosis not present

## 2022-04-14 MED ORDER — MELOXICAM 15 MG PO TABS: ORAL_TABLET | ORAL | 3 refills | Status: DC

## 2022-04-14 MED ORDER — MELOXICAM 15 MG PO TABS
ORAL_TABLET | ORAL | 3 refills | Status: DC
  Filled 2022-04-14: qty 45, 45d supply, fill #0

## 2022-04-14 NOTE — Assessment & Plan Note (Signed)
Acutely occurring.  No defect appreciated but does appear to have irritation at the proximal portion.  This has close association to the sciatic nerve which does seem to be irritated with the pain that he is experiencing in the distal part of his leg. -Counseled on home exercise therapy and supportive care. -Meloxicam. -Could consider shockwave therapy or physical therapy.

## 2022-04-14 NOTE — Patient Instructions (Signed)
Good to see you Please try heat  Please try to roll the area  Please try the hamstring curl and roman deadlift  You can advance to the glute ham  Please send me a message in MyChart with any questions or updates.  Please see me back as needed.   --Dr. Jordan Likes

## 2022-04-14 NOTE — Progress Notes (Signed)
  Blake Rhodes - 56 y.o. male MRN [id number]  Date of birth: Dec 19, 1965  SUBJECTIVE:  Including CC & ROS.  No chief complaint on file.   Blake Rhodes is a 56 y.o. male that is presenting with acute left hamstring pain.  The pain is occurring at the proximal portion of the posterior thigh.  Felt the pain after exercising a day ago.  Was having trouble walking.    Review of Systems See HPI   HISTORY: Past Medical, Surgical, Social, and Family History Reviewed & Updated per EMR.   Pertinent Historical Findings include:  Past Medical History:  Diagnosis Date   Acute low back pain with sciatica    Hyperlipidemia    Sciatica bACK     Past Surgical History:  Procedure Laterality Date   TONSILLECTOMY       PHYSICAL EXAM:  VS: BP 128/80 (BP Location: Left Arm, Patient Position: Sitting)   Ht 5\' 10"  (1.778 m)   Wt 172 lb (78 kg)   BMI 24.68 kg/m  Physical Exam Gen: NAD, alert, cooperative with exam, well-appearing MSK:  Neurovascularly intact    Limited ultrasound: left hamstring:  Has hyperemia at the proximal portion of the hamstring tendon at the origin. No tear or defect appreciated of the tendon or muscle. This has close association with the sciatic nerve  Summary: Findings consistent with hamstring strain.  Ultrasound and interpretation by , MD    ASSESSMENT & PLAN:   Hamstring strain, left, initial encounter Acutely occurring.  No defect appreciated but does appear to have irritation at the proximal portion.  This has close association to the sciatic nerve which does seem to be irritated with the pain that he is experiencing in the distal part of his leg. -Counseled on home exercise therapy and supportive care. -Meloxicam. -Could consider shockwave therapy or physical therapy.

## 2022-04-14 NOTE — Telephone Encounter (Signed)
   Blake Rhodes  Wants to see me for asthma like symptoms. He wants to get cxr and full pft with BD with feno - pleaes facilitate this ASAP.  Given fact 05/25/22 is waiiting time for PFT- he can just do cxr and feno and see me before that next few weeks

## 2022-04-18 NOTE — Telephone Encounter (Signed)
ATC pt to schedule consult with MR; line rang a  few times then went to busy tone. Sent MyChart message to request for pt to call office to schedule.

## 2022-04-20 ENCOUNTER — Other Ambulatory Visit: Payer: Self-pay | Admitting: *Deleted

## 2022-04-20 DIAGNOSIS — Z87898 Personal history of other specified conditions: Secondary | ICD-10-CM

## 2022-04-20 NOTE — Telephone Encounter (Signed)
Pt has been scheduled for an appt 9/21 at 8:30 with MR.

## 2022-04-21 ENCOUNTER — Other Ambulatory Visit (HOSPITAL_BASED_OUTPATIENT_CLINIC_OR_DEPARTMENT_OTHER): Payer: Self-pay

## 2022-04-21 ENCOUNTER — Encounter: Payer: Self-pay | Admitting: Internal Medicine

## 2022-04-21 ENCOUNTER — Ambulatory Visit: Payer: 59 | Admitting: Internal Medicine

## 2022-04-21 ENCOUNTER — Ambulatory Visit: Payer: 59

## 2022-04-21 VITALS — BP 142/82 | HR 65 | Temp 97.8°F | Ht 70.0 in | Wt 170.0 lb

## 2022-04-21 DIAGNOSIS — Z87898 Personal history of other specified conditions: Secondary | ICD-10-CM

## 2022-04-21 DIAGNOSIS — Z8616 Personal history of COVID-19: Secondary | ICD-10-CM | POA: Diagnosis not present

## 2022-04-21 DIAGNOSIS — Z889 Allergy status to unspecified drugs, medicaments and biological substances status: Secondary | ICD-10-CM | POA: Diagnosis not present

## 2022-04-21 LAB — NITRIC OXIDE: FeNO level (ppb): 74

## 2022-04-21 MED ORDER — FLUTICASONE FUROATE-VILANTEROL 100-25 MCG/ACT IN AEPB
1.0000 | INHALATION_SPRAY | Freq: Every day | RESPIRATORY_TRACT | 5 refills | Status: DC
  Filled 2022-04-21: qty 60, 60d supply, fill #0

## 2022-04-21 NOTE — Progress Notes (Signed)
OV 04/21/2022  Subjective:  Patient ID: Blake Rhodes, male , DOB: Feb 05, 1966 , age 56 y.o. , MRN: [id number] , ADDRESS: [address] [place] [address] PCP Vivi Barrack, MD Patient Care Team: Vivi Barrack, MD as PCP - General (Family Medicine)  This Provider for this visit: Treatment Team:  Attending Provider: Brand Males, MD    04/21/2022 -   Chief Complaint  Patient presents with   Consult    Pt has had complaints of wheezing and coughing in the morning coughing up clear phlegm. Pt states when he does have problems with wheezing he will sometimes become SOB.     HPI Blake Rhodes 56 y.o. -male originally from [place] in the [place].  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  He says while in [place] many years ago not otherwise specified because of allergy issues got extensively skin test that was positive for several things which surprised him.  But after moving to [facility] he has been relatively well.  Then in February 2023 after visit to [place] he got COVID-19.  He did take antiviral.  After that he had a postviral reactive cough that was present on and off and ultimately it went away but he has been left with a residual wheezing and wheezing associated cough.  This happens randomly and unpredictably but some of the consistent patterns appears to be that it presents early in the a.m.  Cold air makes it worse.  He has also noticed that when the cleaning lady skips a visit in the house gets a little bit more dusty his symptoms do go up.  He it reduces with drinking hot tea or taking Hall's lozenge or doing steam inhalation.  It gets worse after heavy dinner and again gets better after taking green tea.  Albuterol nebulizers help.  Also notices that he is got no problems doing workout in the gym but after he comes back home and if he is rushing  he starts having the wheezing.  Definitely wheezing present at night intermittently.  This does happen in sleep.  The wheezing associated cough is not present at night.  Its only present other times when he is wheezing.  The symptoms have been gone for a few months now.  His grandson father mother had asthma.  His brother had bronchitis.  His mom has allergies.  But there is no strong family history of asthma.  His exhaled nitric oxide testing is elevated today at 74 and suggestive of eosinophilic airway inflammation.  .      04/21/2022               ACT 17      FeNO 74      Fev1 x      intervention Start breo             has a past medical history of Acute low back pain with sciatica, Hyperlipidemia, and Sciatica bACK.   reports that he has never smoked. He has never used smokeless tobacco.  Past Surgical History:  Procedure Laterality Date   TONSILLECTOMY      No Known Allergies  Immunization History  Administered Date(s) Administered   Influenza,inj,Quad PF,6+ Mos 03/25/2019   Influenza-Unspecified 04/30/2021   PFIZER(Purple Top)SARS-COV-2 Vaccination 08/21/2019, 09/18/2019, 06/13/2020    Family History  Problem Relation Age of Onset  Diabetes Mother    Thyroid disease Mother    Dementia Father    High blood pressure Father    Hypertension Father    Mental illness Father    Healthy Brother    Diabetes Maternal Grandfather    Hypertension Paternal Grandmother    Heart disease Paternal Grandfather    Healthy Son      Current Outpatient Medications:    albuterol (VENTOLIN HFA) 108 (90 Base) MCG/ACT inhaler, Inhale 2 puffs into the lungs every 6 (six) hours as needed for wheezing or shortness of breath., Disp: 8 g, Rfl: 1   Diclofenac Sodium 3 % GEL, Apply 2-4 grams to affected joint 4 times daily as needed., Disp: 400 g, Rfl: 2   fluticasone furoate-vilanterol (BREO ELLIPTA) 100-25 MCG/ACT AEPB, Inhale 1 puff into the lungs daily., Disp: 60 each, Rfl: 5    meloxicam (MOBIC) 15 MG tablet, Take 1 tablet by mouth every morning with breakfast for 2 weeks, then daily as needed for pain, Disp: 45 tablet, Rfl: 3   traZODone (DESYREL) 50 MG tablet, Take 1 tablet by mouth at bedtime, Disp: 90 tablet, Rfl: 1      Objective:   Vitals:   04/21/22 0853  BP: (!) 142/82  Pulse: 65  Temp: 97.8 F (36.6 C)  TempSrc: Oral  SpO2: 96%  Weight: 170 lb (77.1 kg)  Height: 5\' 10"  (1.778 m)    Estimated body mass index is 24.39 kg/m as calculated from the following:   Height as of this encounter: 5\' 10"  (1.778 m).   Weight as of this encounter: 170 lb (77.1 kg).  @WEIGHTCHANGE @    04/21/22 0853  Weight: 170 lb (77.1 kg)     Physical Exam  General: No distress. Looks well Neuro: Alert and Oriented x 3. GCS 15. Speech normal Psych: Pleasant Resp:  Barrel Chest - no.  Wheeze - no, Crackles - no, No overt respiratory distress.Maybe faint wheeze when I had him cough CVS: Normal heart sounds. Murmurs - no Ext: Stigmata of Connective Tissue Disease - no HEENT: Normal upper airway. PEERL +. No post nasal drip        Assessment:       ICD-10-CM   1. History of wheezing  Z87.898     2. History of 2019 novel coronavirus disease (COVID-19)  Z86.16     3. History of seasonal allergies  Z88.9      She is very consistent with asthma with given the previous allergy history.  Currently unmasked by recent COVID-19 and what 1 would consider is a hit-and-run hypothesis.    Plan:     Patient Instructions     ICD-10-CM   1. History of wheezing  Z87.898     2. History of 2019 novel coronavirus disease (COVID-19)  Z86.16     3. History of seasonal allergies  Z88.9       Diagnosis is c/w allergic type asthma  Plan  - hold off flu shot for next 1-2 weeks  - RSV and covid vaccines when more stable  - start breo low dose 1 puff daily scheduled - albuterol as needed - hold off allergy testing, pft , and CXR for now  Followup -  text Dr 04/23/22 in 2 weeks with response  -2 months or sooner if needed; feno and ACT at followup    SIGNATURE    Dr. 2020, M.D., F.C.C.P,  Pulmonary and Critical Care Medicine Staff Physician, [facility] Director -  Interstitial Lung Disease  Program  [facility] at [facility] [place], [place], [address]  Pager: [phone], If no answer or between  15:00h - 7:00h: call [phone] Telephone: [phone]  12:11 PM 04/21/2022

## 2022-04-21 NOTE — Patient Instructions (Addendum)
ICD-10-CM   1. History of wheezing  Z87.898     2. History of 2019 novel coronavirus disease (COVID-19)  Z86.16     3. History of seasonal allergies  Z88.9       Diagnosis is c/w allergic type asthma  Plan  - hold off flu shot for next 1-2 weeks  - RSV and covid vaccines when more stable  - start breo low dose 1 puff daily scheduled - albuterol as needed - hold off allergy testing, pft , and CXR for now  Followup - text Dr Chase Caller in 2 weeks with response  -2 months or sooner if needed; feno and ACT at followup

## 2022-07-04 ENCOUNTER — Other Ambulatory Visit: Payer: Self-pay | Admitting: *Deleted

## 2022-07-04 DIAGNOSIS — Z889 Allergy status to unspecified drugs, medicaments and biological substances status: Secondary | ICD-10-CM

## 2022-07-04 DIAGNOSIS — Z8616 Personal history of COVID-19: Secondary | ICD-10-CM

## 2022-07-04 DIAGNOSIS — Z87898 Personal history of other specified conditions: Secondary | ICD-10-CM

## 2022-07-05 ENCOUNTER — Ambulatory Visit (INDEPENDENT_AMBULATORY_CARE_PROVIDER_SITE_OTHER): Payer: BC Managed Care – PPO

## 2022-07-05 ENCOUNTER — Ambulatory Visit (INDEPENDENT_AMBULATORY_CARE_PROVIDER_SITE_OTHER): Payer: BC Managed Care – PPO | Admitting: Internal Medicine

## 2022-07-05 VITALS — BP 126/80 | HR 56 | Temp 97.9°F | Ht 70.0 in | Wt 179.0 lb

## 2022-07-05 DIAGNOSIS — Z87898 Personal history of other specified conditions: Secondary | ICD-10-CM

## 2022-07-05 DIAGNOSIS — Z889 Allergy status to unspecified drugs, medicaments and biological substances status: Secondary | ICD-10-CM

## 2022-07-05 DIAGNOSIS — Z23 Encounter for immunization: Secondary | ICD-10-CM | POA: Diagnosis not present

## 2022-07-05 DIAGNOSIS — Z8616 Personal history of COVID-19: Secondary | ICD-10-CM

## 2022-07-05 DIAGNOSIS — J4540 Moderate persistent asthma, uncomplicated: Secondary | ICD-10-CM

## 2022-07-05 DIAGNOSIS — R062 Wheezing: Secondary | ICD-10-CM | POA: Diagnosis not present

## 2022-07-05 LAB — PULMONARY FUNCTION TEST
DL/VA % pred: 118 %
DL/VA: 5.48 ml/min/mmHg/L
DLCO cor % pred: 97 %
DLCO cor: 31.56 ml/min/mmHg
DLCO unc % pred: 97 %
DLCO unc: 31.56 ml/min/mmHg
FEF 25-75 Pre: 2.47 L/sec
FEF2575-%Pred-Pre: 77 %
FEV1-%Pred-Pre: 75 %
FEV1-Pre: 2.68 L
FEV1FVC-%Pred-Pre: 99 %
FEV6-%Pred-Pre: 71 %
FEV6-Pre: 3.38 L
FEV6FVC-%Pred-Pre: 93 %
FVC-%Pred-Pre: 75 %
FVC-Pre: 3.39 L
Pre FEV1/FVC ratio: 79 %
Pre FEV6/FVC Ratio: 100 %
RV % pred: 119 %
RV: 2.52 L
TLC % pred: 98 %
TLC: 6.85 L

## 2022-07-05 LAB — NITRIC OXIDE: Nitric Oxide: 57

## 2022-07-05 NOTE — Patient Instructions (Signed)
Spirometry/DLCO and lung volumes performed today. 

## 2022-07-05 NOTE — Patient Instructions (Addendum)
ICD-10-CM   1. Moderate persistent asthma without complication  J45.40     2. History of wheezing  Z87.898 Nitric oxide    3. History of seasonal allergies  Z88.9     4. Flu vaccine need  Z23        Diagnosis is c/w allergic type asthma Improved but still symptomatic with wheeze and elevated feno   - partial improvement because you are not on breo on daily basis  Plan  - flu shot 07/05/2022 - increase breo to daily regimen from as needed; breo low dose 1 puff daily scheduled - albuterol as needed - cxr 2 view 07/05/2022 - hold off allergy testing, for now  Followup  -3 months or sooner if needed; feno and ACT at followup

## 2022-07-05 NOTE — Progress Notes (Signed)
OV 04/21/2022  Subjective:  Patient ID: Blake Rhodes, male , DOB: [birth year] , age 56 y.o. , MRN: [id number] , ADDRESS: [address] [place] [address] PCP Ardith Dark, MD Patient Care Team: Ardith Dark, MD as PCP - General (Family Medicine)  This Provider for this visit: Treatment Team:  Attending Provider: Kalman Shan, MD    04/21/2022 -   Chief Complaint  Patient presents with   Consult    Pt has had complaints of wheezing and coughing in the morning coughing up clear phlegm. Pt states when he does have problems with wheezing he will sometimes become SOB.     HPI Blake Rhodes 56 y.o. -male originally from [place] in the state of [place].  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  He says while in [place] many years ago not otherwise specified because of allergy issues got extensively skin test that was positive for several things which surprised him.  But after moving to [facility] he has been relatively well.  Then in February 2023 after visit to [place] he got COVID-19.  He did take antiviral.  After that he had a postviral reactive cough that was present on and off and ultimately it went away but he has been left with a residual wheezing and wheezing associated cough.  This happens randomly and unpredictably but some of the consistent patterns appears to be that it presents early in the a.m.  Cold air makes it worse.  He has also noticed that when the cleaning lady skips a visit in the house gets a little bit more dusty his symptoms do go up.  He it reduces with drinking hot tea or taking Hall's lozenge or doing steam inhalation.  It gets worse after heavy dinner and again gets better after taking green tea.  Albuterol nebulizers help.  Also notices that he is got no problems doing workout in the gym but after he comes back home and if he is rushing he  starts having the wheezing.  Definitely wheezing present at night intermittently.  This does happen in sleep.  The wheezing associated cough is not present at night.  Its only present other times when he is wheezing.  The symptoms have been gone for a few months now.  His grandson father mother had asthma.  His brother had bronchitis.  His mom has allergies.  But there is no strong family history of asthma.  His exhaled nitric oxide testing is elevated today at 74 and suggestive of eosinophilic airway inflammation.  .  OV 07/05/2022  Subjective:  Patient ID: Blake Rhodes, male , DOB: [birth year] , age 56 y.o. , MRN: [id number] , ADDRESS: [address] [place] [address] PCP Ardith Dark, MD Patient Care Team: Ardith Dark, MD as PCP - General (Family Medicine)  This Provider for this visit: Treatment Team:  Attending Provider: Kalman Shan, MD    07/05/2022 -   Chief Complaint  Patient presents with   Follow-up    PFT performed today.  Pt states he has been doing okay since last visit.   Moderate persistent allergic asthma  HPI Blake Rhodes 56 y.o. -returns for follow-up.  At this point in time he is doing his Breo maybe 2 or 3 times a week.  He finds partial relief.  He does it in response to wheezing.  When  he rushes in the gym he will have wheezing when he gets home.  When he drinks any warm water or tea he will feel better.  He does have intermittent wheezing and cough.  In response to his as needed regimen his exam nitric oxide is improved to 57 ppb.  His FEV1 is at 2.68 L / 75%.  He has no nocturnal awakenings.  He wanted to know if he can come off inhalers and what efficacy natural remedies bring.  I did indicate to him that he needs inhaled corticosteroids on a scheduled basis.  He is agreeable to get a flu shot.  He feels stable at this point in fact better than last time.        04/21/2022   07/05/2022 Breo prn            ACT 17      FeNO 74 57  ppb     Fev1 x 2.68L/75%     intervention Start breo        Asthma Control Test ACT Total Score  04/21/2022  9:35 AM 17      PFT     Latest Ref Rng & Units 07/05/2022    2:20 PM  PFT Results  FVC-Pre L 3.39  P  FVC-Predicted Pre % 75  P  Pre FEV1/FVC % % 79  P  FEV1-Pre L 2.68  P  FEV1-Predicted Pre % 75  P  DLCO uncorrected ml/min/mmHg 31.56  P  DLCO UNC% % 97  P  DLCO corrected ml/min/mmHg 31.56  P  DLCO COR %Predicted % 97  P  DLVA Predicted % 118  P  TLC L 6.85  P  TLC % Predicted % 98  P  RV % Predicted % 119  P    P Preliminary result       has a past medical history of Acute low back pain with sciatica, Hyperlipidemia, and Sciatica bACK.   reports that he has never smoked. He has never used smokeless tobacco.  Past Surgical History:  Procedure Laterality Date   TONSILLECTOMY      No Known Allergies  Immunization History  Administered Date(s) Administered   Influenza,inj,Quad PF,6+ Mos 03/25/2019   Influenza-Unspecified 04/30/2021   PFIZER(Purple Top)SARS-COV-2 Vaccination 08/21/2019, 09/18/2019, 06/13/2020    Family History  Problem Relation Age of Onset   Diabetes Mother    Thyroid disease Mother    Dementia Father    High blood pressure Father    Hypertension Father    Mental illness Father    Healthy Brother    Diabetes Maternal Grandfather    Hypertension Paternal Grandmother    Heart disease Paternal Grandfather    Healthy Son      Current Outpatient Medications:    albuterol (VENTOLIN HFA) 108 (90 Base) MCG/ACT inhaler, Inhale 2 puffs into the lungs every 6 (six) hours as needed for wheezing or shortness of breath., Disp: 8 g, Rfl: 1   fluticasone furoate-vilanterol (BREO ELLIPTA) 100-25 MCG/ACT AEPB, Inhale 1 puff into the lungs daily., Disp: 60 each, Rfl: 5      Objective:   Vitals:   07/05/22 1459  BP: 126/80  Pulse: (!) 56  Temp: 97.9 F (36.6 C)  TempSrc: Oral  SpO2: 96%  Weight: 179 lb (81.2 kg)  Height: 5\' 10"   (1.778 m)    Estimated body mass index is 25.68 kg/m as calculated from the following:   Height as of this encounter: 5\' 10"  (1.778 m).  Weight as of this encounter: 179 lb (81.2 kg).  @WEIGHTCHANGE @  Filed Weights   07/05/22 1459  Weight: 179 lb (81.2 kg)      General: No distress. Looks well Neuro: Alert and Oriented x 3. GCS 15. Speech normal Psych: Pleasant Resp:  Barrel Chest - no.  Wheeze - yes mild occ, Crackles - no, No overt respiratory distress CVS: Normal heart sounds. Murmurs - no Ext: Stigmata of Connective Tissue Disease - no HEENT: Normal upper airway. PEERL +. No post nasal drip        Assessment:       ICD-10-CM   1. Moderate persistent asthma without complication  J45.40     2. History of wheezing  Z87.898 Nitric oxide    DG Chest 2 View    3. History of seasonal allergies  Z88.9     4. Flu vaccine need  Z23          Plan:     Patient Instructions     ICD-10-CM   1. Moderate persistent asthma without complication  J45.40     2. History of wheezing  Z87.898 Nitric oxide    3. History of seasonal allergies  Z88.9     4. Flu vaccine need  Z23        Diagnosis is c/w allergic type asthma Improved but still symptomatic with wheeze and elevated feno   - partial improvement because you are not on breo on daily basis  Plan  - flu shot 07/05/2022 - increase breo to daily regimen from as needed; breo low dose 1 puff daily scheduled - albuterol as needed - cxr 2 view 07/05/2022 - hold off allergy testing, for now  Followup  -3 months or sooner if needed; feno and ACT at followup    SIGNATURE    Dr. 14/11/2021, M.D., F.C.C.P,  Pulmonary and Critical Care Medicine Staff Physician, [facility] Center Director - Interstitial Lung Disease  Program  Pulmonary Fibrosis [facility] at [facility] [place], [place], [place]  Pager: [phone], If no answer or between  15:00h - 7:00h: call [phone]   [phone] Telephone: [phone]  3:36 PM 07/05/2022

## 2022-07-05 NOTE — Progress Notes (Signed)
Spirometry/DLCO and lung volumes performed today. 

## 2022-07-06 DIAGNOSIS — R030 Elevated blood-pressure reading, without diagnosis of hypertension: Secondary | ICD-10-CM | POA: Diagnosis not present

## 2022-07-06 DIAGNOSIS — H6123 Impacted cerumen, bilateral: Secondary | ICD-10-CM | POA: Diagnosis not present

## 2022-07-06 DIAGNOSIS — H9203 Otalgia, bilateral: Secondary | ICD-10-CM | POA: Diagnosis not present

## 2022-07-06 DIAGNOSIS — Z6830 Body mass index (BMI) 30.0-30.9, adult: Secondary | ICD-10-CM | POA: Diagnosis not present

## 2022-11-14 ENCOUNTER — Encounter: Payer: Self-pay | Admitting: *Deleted

## 2022-11-22 ENCOUNTER — Ambulatory Visit (INDEPENDENT_AMBULATORY_CARE_PROVIDER_SITE_OTHER): Payer: BC Managed Care – PPO | Admitting: Family Medicine

## 2022-11-22 VITALS — BP 133/81 | HR 70 | Temp 97.5°F | Ht 70.0 in | Wt 180.2 lb

## 2022-11-22 DIAGNOSIS — Z125 Encounter for screening for malignant neoplasm of prostate: Secondary | ICD-10-CM | POA: Diagnosis not present

## 2022-11-22 DIAGNOSIS — Z1211 Encounter for screening for malignant neoplasm of colon: Secondary | ICD-10-CM

## 2022-11-22 DIAGNOSIS — Z0001 Encounter for general adult medical examination with abnormal findings: Secondary | ICD-10-CM

## 2022-11-22 DIAGNOSIS — J45909 Unspecified asthma, uncomplicated: Secondary | ICD-10-CM

## 2022-11-22 DIAGNOSIS — Z131 Encounter for screening for diabetes mellitus: Secondary | ICD-10-CM

## 2022-11-22 DIAGNOSIS — Z23 Encounter for immunization: Secondary | ICD-10-CM

## 2022-11-22 DIAGNOSIS — E785 Hyperlipidemia, unspecified: Secondary | ICD-10-CM

## 2022-11-22 MED ORDER — FLUTICASONE FUROATE-VILANTEROL 100-25 MCG/ACT IN AEPB: 1.0000 | INHALATION_SPRAY | Freq: Every day | RESPIRATORY_TRACT | 5 refills | Status: AC

## 2022-11-22 MED ORDER — ALBUTEROL SULFATE HFA 108 (90 BASE) MCG/ACT IN AERS: 2.0000 | INHALATION_SPRAY | Freq: Four times a day (QID) | RESPIRATORY_TRACT | 1 refills | Status: AC | PRN

## 2022-11-22 NOTE — Patient Instructions (Signed)
It was very nice to see you today!  Will order Cologuard.  Please continue to work on diet and exercise.  Please continue work on Optician, dispensing.  We will see you back in a year for your next physical.  Come back sooner if needed.  Return in about 1 year (around 11/22/2023).   Take care, Dr Jimmey Ralph  PLEASE NOTE:  If you had any lab tests, please let us know if you have not heard back within a few days. You may see your results on mychart before we have a chance to review them but we will give you a call once they are reviewed by [place].   If we ordered any referrals today, please let us know if you have not heard from their office within the next week.   If you had any urgent prescriptions sent in today, please check with the pharmacy within an hour of our visit to make sure the prescription was transmitted appropriately.   Please try these tips to maintain a healthy lifestyle:  Eat at least 3 REAL meals and 1-2 snacks per day.  Aim for no more than 5 hours between eating.  If you eat breakfast, please do so within one hour of getting up.   Each meal should contain half fruits/vegetables, one quarter protein, and one quarter carbs (no bigger than a computer mouse)  Cut down on sweet beverages. This includes juice, soda, and sweet tea.   Drink at least 1 glass of water with each meal and aim for at least 8 glasses per day  Exercise at least 150 minutes every week.    Preventive Care 20-58 Years Old, Male Preventive care refers to lifestyle choices and visits with your health care provider that can promote health and wellness. Preventive care visits are also called wellness exams. What can I expect for my preventive care visit? Counseling During your preventive care visit, your health care provider may ask about your: Medical history, including: Past medical problems. Family medical history. Current health, including: Emotional well-being. Home life and relationship  well-being. Sexual activity. Lifestyle, including: Alcohol, nicotine or tobacco, and drug use. Access to firearms. Diet, exercise, and sleep habits. Safety issues such as seatbelt and bike helmet use. Sunscreen use. Work and work Astronomer. Physical exam Your health care provider will check your: Height and weight. These may be used to calculate your BMI (body mass index). BMI is a measurement that tells if you are at a healthy weight. Waist circumference. This measures the distance around your waistline. This measurement also tells if you are at a healthy weight and may help predict your risk of certain diseases, such as type 2 diabetes and high blood pressure. Heart rate and blood pressure. Body temperature. Skin for abnormal spots. What immunizations do I need?  Vaccines are usually given at various ages, according to a schedule. Your health care provider will recommend vaccines for you based on your age, medical history, and lifestyle or other factors, such as travel or where you work. What tests do I need? Screening Your health care provider may recommend screening tests for certain conditions. This may include: Lipid and cholesterol levels. Diabetes screening. This is done by checking your blood sugar (glucose) after you have not eaten for a while (fasting). Hepatitis B test. Hepatitis C test. HIV (human immunodeficiency virus) test. STI (sexually transmitted infection) testing, if you are at risk. Lung cancer screening. Prostate cancer screening. Colorectal cancer screening. Talk with your health care provider about  your test results, treatment options, and if necessary, the need for more tests. Follow these instructions at home: Eating and drinking  Eat a diet that includes fresh fruits and vegetables, whole grains, lean protein, and low-fat dairy products. Take vitamin and mineral supplements as recommended by your health care provider. Do not drink alcohol if your  health care provider tells you not to drink. If you drink alcohol: Limit how much you have to 0-2 drinks a day. Know how much alcohol is in your drink. In the [place] one drink equals one 12 oz bottle of beer (355 mL), one 5 oz glass of wine (148 mL), or one 1 oz glass of hard liquor (44 mL). Lifestyle Brush your teeth every morning and night with fluoride toothpaste. Floss one time each day. Exercise for at least 30 minutes 5 or more days each week. Do not use any products that contain nicotine or tobacco. These products include cigarettes, chewing tobacco, and vaping devices, such as e-cigarettes. If you need help quitting, ask your health care provider. Do not use drugs. If you are sexually active, practice safe sex. Use a condom or other form of protection to prevent STIs. Take aspirin only as told by your health care provider. Make sure that you understand how much to take and what form to take. Work with your health care provider to find out whether it is safe and beneficial for you to take aspirin daily. Find healthy ways to manage stress, such as: Meditation, yoga, or listening to music. Journaling. Talking to a trusted person. Spending time with friends and family. Minimize exposure to UV radiation to reduce your risk of skin cancer. Safety Always wear your seat belt while driving or riding in a vehicle. Do not drive: If you have been drinking alcohol. Do not ride with someone who has been drinking. When you are tired or distracted. While texting. If you have been using any mind-altering substances or drugs. Wear a helmet and other protective equipment during sports activities. If you have firearms in your house, make sure you follow all gun safety procedures. What's next? Go to your health care provider once a year for an annual wellness visit. Ask your health care provider how often you should have your eyes and teeth checked. Stay up to date on all vaccines. This information  is not intended to replace advice given to you by your health care provider. Make sure you discuss any questions you have with your health care provider. Document Revised: 01/13/2021 Document Reviewed: 01/13/2021 Elsevier Patient Education  2023 [employer].

## 2022-11-22 NOTE — Assessment & Plan Note (Signed)
Stable.  Will refill has Breo ellipta and albuterol.

## 2022-11-22 NOTE — Assessment & Plan Note (Signed)
Check lipids 

## 2022-11-22 NOTE — Progress Notes (Signed)
Chief Complaint:  Blake Rhodes is a 57 y.o. male who presents today for his annual comprehensive physical exam.    Assessment/Plan:  New/Acute Problems: Elevated BP Reading Initially a bit today.  He has been under more stress recently.  He has been well-controlled at his last several office visits.  On recheck 133/81.  He will continue to monitor at home and let us know if persistently elevated.  Frequent stools No red flags.  Will check labs today.  Will also order cologuard.  May have small amount of underlying IBS.  He has been under more stress recently which is contributing as well.  He is getting plenty of fiber in his diet.  He can try Imodium if needed.  He will let us know if symptoms worsen or if any new symptoms arise.  Will continue with watchful waiting for now however can refer to GI if worsening.  Chronic Problems Addressed Today: Dyslipidemia Check lipids.   Moderate asthma without complication Stable.  Will refill has Breo ellipta and albuterol.  Preventative Healthcare: Check labs.  Order Cologuard.  Tdap given today.  Patient Counseling(The following topics were reviewed and/or handout was given):  -Nutrition: Stressed importance of moderation in sodium/caffeine intake, saturated fat and cholesterol, caloric balance, sufficient intake of fresh fruits, vegetables, and fiber.  -Stressed the importance of regular exercise.   -Substance Abuse: Discussed cessation/primary prevention of tobacco, alcohol, or other drug use; driving or other dangerous activities under the influence; availability of treatment for abuse.   -Injury prevention: Discussed safety belts, safety helmets, smoke detector, smoking near bedding or upholstery.   -Sexuality: Discussed sexually transmitted diseases, partner selection, use of condoms, avoidance of unintended pregnancy and contraceptive alternatives.   -Dental health: Discussed importance of regular tooth brushing, flossing, and dental  visits.  -Health maintenance and immunizations reviewed. Please refer to Health maintenance section.  Return to care in 1 year for next preventative visit.     Subjective:  HPI:  He has no acute complaints today. See A/p for status of chronic conditions.   He has had more stress recently.  Recently lost his job and is planning on relocating to [place] soon.  He is managing his stress via exercise.  He has good support at home.  He has noticed he has had more frequent stools recently as well.  Sometimes 3-4 times per day. No diarrhea but it can be loose.  No blood in stool.  No cramping.  Lifestyle Diet: Balanced. Plenty of fruits and vegetables.  Exercise: Goes to gym. Works on elliptical we will check blood work today and [employer] training.      11/22/2022    1:56 PM  Depression screen PHQ 2/9  Decreased Interest 0  Down, Depressed, Hopeless 0  PHQ - 2 Score 0    Health Maintenance Due  Topic Date Due   Fecal DNA (Cologuard)  Never done   Zoster Vaccines- Shingrix (1 of 2) Never done     ROS: Per HPI, otherwise a complete review of systems was negative.   PMH:  The following were reviewed and entered/updated in epic: Past Medical History:  Diagnosis Date   Acute low back pain with sciatica    Hyperlipidemia    Sciatica bACK    Patient Active Problem List   Diagnosis Date Noted   Moderate asthma without complication 11/22/2022   Insomnia 04/26/2021   Sleep-disordered breathing 04/26/2021   Dyslipidemia 03/26/2019   Low back pain 03/25/2019   Past Surgical  History:  Procedure Laterality Date   TONSILLECTOMY      Family History  Problem Relation Age of Onset   Diabetes Mother    Thyroid disease Mother    Dementia Father    High blood pressure Father    Hypertension Father    Mental illness Father    Healthy Brother    Diabetes Maternal Grandfather    Hypertension Paternal Grandmother    Heart disease Paternal Grandfather    Healthy Son      Medications- reviewed and updated Current Outpatient Medications  Medication Sig Dispense Refill   albuterol (VENTOLIN HFA) 108 (90 Base) MCG/ACT inhaler Inhale 2 puffs into the lungs every 6 (six) hours as needed for wheezing or shortness of breath. 8 g 1   fluticasone furoate-vilanterol (BREO ELLIPTA) 100-25 MCG/ACT AEPB Inhale 1 puff into the lungs daily. 60 each 5   No current facility-administered medications for this visit.    Allergies-reviewed and updated No Known Allergies  Social History   Socioeconomic History   Marital status: Married    Spouse name: Not on file   Number of children: Not on file   Years of education: Not on file   Highest education level: Not on file  Occupational History   Not on file  Tobacco Use   Smoking status: Never   Smokeless tobacco: Never  Vaping Use   Vaping Use: Never used  Substance and Sexual Activity   Alcohol use: Yes    Alcohol/week: 2.0 standard drinks of alcohol    Types: 2 Cans of beer per week    Comment: socially/rarely   Drug use: No   Sexual activity: Yes  Other Topics Concern   Not on file  Social History Narrative   Not on file   Social Determinants of Health   Financial Resource Strain: Not on file  Food Insecurity: Not on file  Transportation Needs: Not on file  Physical Activity: Not on file  Stress: Not on file  Social Connections: Not on file        Objective:  Physical Exam: BP 133/81   Pulse 70   Temp (!) 97.5 F (36.4 C) (Temporal)   Ht  (1.778 m)   Wt 180 lb 3.2 oz (81.7 kg)   SpO2 99%   BMI 25.86 kg/m   Body mass index is 25.86 kg/m. Wt Readings from Last 3 Encounters:  11/22/22 180 lb 3.2 oz (81.7 kg)  07/05/22 179 lb (81.2 kg)  04/21/22 170 lb (77.1 kg)   Gen: NAD, resting comfortably HEENT: TMs normal bilaterally. OP clear. No thyromegaly noted.  CV: RRR with no murmurs appreciated Pulm: NWOB, occasional wheeze noted.  Otherwise clear to auscultation bilaterally GI:  Normal bowel sounds present. Soft, Nontender, Nondistended. MSK: no edema, cyanosis, or clubbing noted Skin: warm, dry Neuro: CN2-12 grossly intact. Strength 5/5 in upper and lower extremities. Reflexes symmetric and intact bilaterally.  Psych: Normal affect and thought content     Blake Rhodes M. Jimmey Ralph, MD 11/22/2022 2:54 PM

## 2022-11-23 LAB — COMPREHENSIVE METABOLIC PANEL
ALT: 37 U/L (ref 0–53)
AST: 34 U/L (ref 0–37)
Albumin: 4.7 g/dL (ref 3.5–5.2)
Alkaline Phosphatase: 68 U/L (ref 39–117)
BUN: 9 mg/dL (ref 6–23)
CO2: 27 mEq/L (ref 19–32)
Calcium: 10 mg/dL (ref 8.4–10.5)
Chloride: 100 mEq/L (ref 96–112)
Creatinine, Ser: 1.07 mg/dL (ref 0.40–1.50)
GFR: 78.31 mL/min (ref >60.00)
Glucose, Bld: 91 mg/dL (ref 70–99)
Potassium: 4.4 mEq/L (ref 3.5–5.1)
Sodium: 136 mEq/L (ref 135–145)
Total Bilirubin: 0.5 mg/dL (ref 0.2–1.2)
Total Protein: 7.3 g/dL (ref 6.0–8.3)

## 2022-11-23 LAB — LIPID PANEL
Cholesterol: 173 mg/dL (ref 0–200)
HDL: 31.2 mg/dL — ABNORMAL LOW (ref >39.00)
NonHDL: 141.96
Total CHOL/HDL Ratio: 6
Triglycerides: 342 mg/dL — ABNORMAL HIGH (ref 0.0–149.0)
VLDL: 68.4 mg/dL — ABNORMAL HIGH (ref 0.0–40.0)

## 2022-11-23 LAB — HEMOGLOBIN A1C: Hgb A1c MFr Bld: 6 % (ref 4.6–6.5)

## 2022-11-23 LAB — PSA: PSA: 0.34 ng/mL (ref 0.10–4.00)

## 2022-11-23 LAB — CBC
HCT: 47.5 % (ref 39.0–52.0)
Hemoglobin: 16 g/dL (ref 13.0–17.0)
MCHC: 33.8 g/dL (ref 30.0–36.0)
MCV: 84 fl (ref 78.0–100.0)
Platelets: 166 10*3/uL (ref 150.0–400.0)
RBC: 5.65 Mil/uL (ref 4.22–5.81)
RDW: 12.9 % (ref 11.5–15.5)
WBC: 6.1 10*3/uL (ref 4.0–10.5)

## 2022-11-23 LAB — TSH: TSH: 1.18 u[IU]/mL (ref 0.35–5.50)

## 2022-11-23 LAB — LDL CHOLESTEROL, DIRECT: Direct LDL: 101 mg/dL

## 2022-11-24 NOTE — Progress Notes (Signed)
Cholesterol and blood sugar borderline but stable compared to previous values.  Nurses labs are normal.  Do not need to make any changes to treatment plan.  He should continue to work on diet and exercise and we can recheck everything in a year.

## 2023-05-30 ENCOUNTER — Encounter: Payer: Self-pay | Admitting: Family Medicine
